# Patient Record
Sex: Male | Born: 1955
Health system: Southern US, Community
[De-identification: ages and names within clinical notes are randomized; demographics above are authoritative.]

## PROBLEM LIST (undated history)

## (undated) DIAGNOSIS — K5790 Diverticulosis of intestine, part unspecified, without perforation or abscess without bleeding: Secondary | ICD-10-CM

## (undated) DIAGNOSIS — D039 Melanoma in situ, unspecified: Secondary | ICD-10-CM

## (undated) DIAGNOSIS — I10 Essential (primary) hypertension: Secondary | ICD-10-CM

## (undated) DIAGNOSIS — Z973 Presence of spectacles and contact lenses: Secondary | ICD-10-CM

## (undated) DIAGNOSIS — N400 Enlarged prostate without lower urinary tract symptoms: Secondary | ICD-10-CM

## (undated) HISTORY — PX: CATARACT EXTRACTION: SUR2

## (undated) HISTORY — DX: Benign prostatic hyperplasia without lower urinary tract symptoms: N40.0

## (undated) HISTORY — DX: Melanoma in situ, unspecified: D03.9

## (undated) HISTORY — DX: Essential (primary) hypertension: I10

## (undated) HISTORY — PX: BIOPSY EYE MUSCLE: PRO14

## (undated) HISTORY — PX: OTHER SURGICAL HISTORY: SHX169

## (undated) HISTORY — DX: Diverticulosis of intestine, part unspecified, without perforation or abscess without bleeding: K57.90

---

## 1992-02-28 HISTORY — PX: HERNIA REPAIR: SHX51

## 2006-06-11 ENCOUNTER — Ambulatory Visit: Payer: Self-pay | Admitting: Family Medicine

## 2006-07-06 LAB — HM COLONOSCOPY

## 2007-05-21 ENCOUNTER — Ambulatory Visit: Payer: Self-pay | Admitting: Family Medicine

## 2007-06-26 ENCOUNTER — Ambulatory Visit: Payer: Self-pay | Admitting: Family Medicine

## 2007-07-26 ENCOUNTER — Ambulatory Visit: Payer: Self-pay | Admitting: Family Medicine

## 2007-08-28 ENCOUNTER — Ambulatory Visit: Payer: Self-pay | Admitting: Family Medicine

## 2008-10-19 ENCOUNTER — Ambulatory Visit: Payer: Self-pay | Admitting: Family Medicine

## 2008-11-11 ENCOUNTER — Ambulatory Visit: Payer: Self-pay | Admitting: Family Medicine

## 2009-03-10 ENCOUNTER — Ambulatory Visit: Payer: Self-pay | Admitting: Family Medicine

## 2009-10-26 ENCOUNTER — Ambulatory Visit: Payer: Self-pay | Admitting: Family Medicine

## 2010-11-02 ENCOUNTER — Encounter: Payer: Self-pay | Admitting: Family Medicine

## 2010-11-07 ENCOUNTER — Ambulatory Visit (INDEPENDENT_AMBULATORY_CARE_PROVIDER_SITE_OTHER): Payer: Federal, State, Local not specified - PPO | Admitting: Family Medicine

## 2010-11-07 ENCOUNTER — Encounter: Payer: Self-pay | Admitting: Family Medicine

## 2010-11-07 VITALS — BP 130/88 | HR 88 | Ht 65.0 in | Wt 103.0 lb

## 2010-11-07 DIAGNOSIS — N529 Male erectile dysfunction, unspecified: Secondary | ICD-10-CM

## 2010-11-07 DIAGNOSIS — N400 Enlarged prostate without lower urinary tract symptoms: Secondary | ICD-10-CM | POA: Insufficient documentation

## 2010-11-07 DIAGNOSIS — I10 Essential (primary) hypertension: Secondary | ICD-10-CM | POA: Insufficient documentation

## 2010-11-07 DIAGNOSIS — Z0000 Encounter for general adult medical examination without abnormal findings: Secondary | ICD-10-CM

## 2010-11-07 DIAGNOSIS — N486 Induration penis plastica: Secondary | ICD-10-CM | POA: Insufficient documentation

## 2010-11-07 LAB — COMPREHENSIVE METABOLIC PANEL
ALT: 10 U/L (ref 0–53)
AST: 17 U/L (ref 0–37)
Albumin: 4.1 g/dL (ref 3.5–5.2)
Calcium: 9.1 mg/dL (ref 8.4–10.5)
Chloride: 100 mEq/L (ref 96–112)
Potassium: 3.9 mEq/L (ref 3.5–5.3)

## 2010-11-07 LAB — POCT URINALYSIS DIPSTICK
Blood, UA: NEGATIVE
Nitrite, UA: NEGATIVE
Spec Grav, UA: 1.005
Urobilinogen, UA: NEGATIVE
pH, UA: 5

## 2010-11-07 LAB — CBC WITH DIFFERENTIAL/PLATELET
Basophils Relative: 1 % (ref 0–1)
Eosinophils Absolute: 0.2 10*3/uL (ref 0.0–0.7)
Eosinophils Relative: 3 % (ref 0–5)
MCH: 34 pg (ref 26.0–34.0)
MCHC: 35.3 g/dL (ref 30.0–36.0)
MCV: 96.3 fL (ref 78.0–100.0)
Monocytes Relative: 7 % (ref 3–12)
Neutrophils Relative %: 73 % (ref 43–77)
Platelets: 223 10*3/uL (ref 150–400)

## 2010-11-07 MED ORDER — HYDROCHLOROTHIAZIDE 12.5 MG PO CAPS: 12.5000 mg | ORAL_CAPSULE | Freq: Every day | ORAL | Status: DC

## 2010-11-07 MED ORDER — TADALAFIL 10 MG PO TABS: 10.0000 mg | ORAL_TABLET | Freq: Every day | ORAL | Status: DC | PRN

## 2010-11-07 NOTE — Progress Notes (Signed)
  Subjective:    Patient ID: Jerry Douglas, male    DOB: 25-Feb-1956, 55 y.o.   MRN: [id number]  HPI He is here for complete examination. He did see a urologist recently and is being treated for Peyronie's disease with vitamin D. There is some improvement. He also continues on Wellbutrin. He was put on initially to help with smoking but most recently to help deal with work related stress. He is planning to retire within the next several months and we will readdress the use at that time. He does have erectile dysfunction and would like a refill on his Cialis. Family history and social history was reviewed. He is in a very stable long-term relationship.   Review of Systems  Constitutional: Negative.   HENT: Negative.   Eyes: Negative.   Respiratory: Negative.   Cardiovascular: Negative.   Gastrointestinal: Negative.   Genitourinary: Negative.   Musculoskeletal: Negative.   Skin: Negative.   Neurological: Negative.   Psychiatric/Behavioral: Negative.        Objective:   Physical Exam BP 130/88  Pulse 88  Ht 5\' 5"  (1.651 m)  Wt 103 lb (46.72 kg)  BMI 17.14 kg/m2  General Appearance:    Alert, cooperative, no distress, appears stated age  Head:    Normocephalic, without obvious abnormality, atraumatic  Eyes:    PERRL, conjunctiva/corneas clear, EOM's intact, fundi    benign  Ears:    Normal TM's and external ear canals  Nose:   Nares normal, mucosa normal, no drainage or sinus   tenderness  Throat:   Lips, mucosa, and tongue normal; teeth and gums normal  Neck:   Supple, no lymphadenopathy;  thyroid:  no   enlargement/tenderness/nodules; no carotid   bruit or JVD  Back:    Spine nontender, no curvature, ROM normal, no CVA     tenderness  Lungs:     Clear to auscultation bilaterally without wheezes, rales or     ronchi; respirations unlabored  Chest Wall:    No tenderness or deformity   Heart:    Regular rate and rhythm, S1 and S2 normal, no murmur, rub   or gallop  Breast Exam:     No chest wall tenderness, masses or gynecomastia  Abdomen:     Soft, non-tender, nondistended, normoactive bowel sounds,    no masses, no hepatosplenomegaly        Extremities:   No clubbing, cyanosis or edema  Pulses:   2+ and symmetric all extremities  Skin:   Skin color, texture, turgor normal, no rashes or lesions  Lymph nodes:   Cervical, supraclavicular, and axillary nodes normal  Neurologic:   CNII-XII intact, normal strength, sensation and gait; reflexes 2+ and symmetric throughout          Psych:   Normal mood, affect, hygiene and grooming.           Assessment & Plan:   1. Physical exam, annual  POCT Urinalysis Dipstick, CBC with Differential, Comp Met (CMET), Lipid panel  2. Peyronie's disease    3. Hypertension    4. BPH (benign prostatic hyperplasia)    5. ED (erectile dysfunction)     His blood pressure medicine and Cialis were renewed. He will followup with his urologist concerning the Peyroni's disease

## 2010-11-08 ENCOUNTER — Telehealth: Payer: Self-pay | Admitting: Family Medicine

## 2010-11-08 ENCOUNTER — Telehealth: Payer: Self-pay

## 2010-11-08 MED ORDER — BUPROPION HCL ER (SR) 150 MG PO TB12: 150.0000 mg | ORAL_TABLET | Freq: Two times a day (BID) | ORAL | Status: DC

## 2010-11-08 NOTE — Telephone Encounter (Signed)
Let him know that the medication was called in

## 2010-11-08 NOTE — Telephone Encounter (Signed)
Called to inform pt labs ok

## 2011-01-31 ENCOUNTER — Ambulatory Visit (INDEPENDENT_AMBULATORY_CARE_PROVIDER_SITE_OTHER): Payer: Federal, State, Local not specified - PPO | Admitting: Family Medicine

## 2011-01-31 ENCOUNTER — Encounter: Payer: Self-pay | Admitting: Family Medicine

## 2011-01-31 VITALS — BP 142/90 | HR 86 | Wt 103.0 lb

## 2011-01-31 DIAGNOSIS — Z5689 Other problems related to employment: Secondary | ICD-10-CM

## 2011-01-31 DIAGNOSIS — Z566 Other physical and mental strain related to work: Secondary | ICD-10-CM

## 2011-01-31 NOTE — Progress Notes (Signed)
  Subjective:    Patient ID: Jerry Douglas, male    DOB: 1955/04/23, 55 y.o.   MRN: [id number]  HPI Is here for consultation concerning using of his sick time prior to retiring. He can be out of work for 3 days before he needs a note. He would like me to write him a note to stay out of work for several weeks prior to his retiring. I explained to him that I have no medical reason to take him out of work. He then stated taking them out for work related stress. I then relayed the fact that everybody can take time out of work for stress and if this was an issue he would need to be referred to counseling. He did understand and accept my explanation.   Review of Systems     Objective:   Physical Exam        Assessment & Plan:

## 2011-07-13 ENCOUNTER — Encounter (HOSPITAL_COMMUNITY): Admission: EM | Disposition: A | Discharge status: Home / Self Care | Payer: Self-pay | Source: Home / Self Care

## 2011-07-13 ENCOUNTER — Encounter (HOSPITAL_COMMUNITY): Payer: Self-pay | Admitting: *Deleted

## 2011-07-13 ENCOUNTER — Emergency Department (HOSPITAL_COMMUNITY)
Admission: EM | Admit: 2011-07-13 | Discharge: 2011-07-14 | Disposition: A | Discharge status: Home / Self Care | Payer: Federal, State, Local not specified - PPO | Attending: Gastroenterology | Admitting: Gastroenterology

## 2011-07-13 DIAGNOSIS — R1310 Dysphagia, unspecified: Secondary | ICD-10-CM | POA: Insufficient documentation

## 2011-07-13 DIAGNOSIS — Z79899 Other long term (current) drug therapy: Secondary | ICD-10-CM | POA: Insufficient documentation

## 2011-07-13 DIAGNOSIS — I10 Essential (primary) hypertension: Secondary | ICD-10-CM | POA: Insufficient documentation

## 2011-07-13 DIAGNOSIS — M79609 Pain in unspecified limb: Secondary | ICD-10-CM | POA: Insufficient documentation

## 2011-07-13 DIAGNOSIS — M79606 Pain in leg, unspecified: Secondary | ICD-10-CM

## 2011-07-13 DIAGNOSIS — R6889 Other general symptoms and signs: Secondary | ICD-10-CM | POA: Insufficient documentation

## 2011-07-13 HISTORY — PX: ESOPHAGOGASTRODUODENOSCOPY: SHX5428

## 2011-07-13 SURGERY — EGD (ESOPHAGOGASTRODUODENOSCOPY)
Anesthesia: Moderate Sedation

## 2011-07-13 MED ORDER — SODIUM CHLORIDE 0.9 % IV BOLUS (SEPSIS)
500.0000 mL | Freq: Once | INTRAVENOUS | Status: AC
  Administered 2011-07-13: 500 mL via INTRAVENOUS

## 2011-07-13 NOTE — ED Notes (Signed)
Pt still able to talk in complete sentences, no respiratory difficulties.  Food lodged in esophagus.

## 2011-07-13 NOTE — ED Notes (Signed)
Pt presented to the Er with c/o "choking", pt states that after eating piece of food got stock, pt ab;e to speak and breath, however, states "it is still stuck and bothering me", peace of stake

## 2011-07-13 NOTE — ED Notes (Addendum)
Pt becoming increasingly impatient, wants to know where the doctor is and why he hasn't been seen yet.  Tried to explain how the process work but he just went back into his room and shut the door.

## 2011-07-13 NOTE — ED Notes (Signed)
Pt in c/o piece of steak being stuck in throat, states that he is unable to tolerate and fluids, pt not tolerating saliva at this time, pt talking

## 2011-07-13 NOTE — ED Provider Notes (Signed)
History     CSN: [id number]  Arrival date & time 07/13/11  2008   First MD Initiated Contact with Patient 07/13/11 2231      Chief Complaint  Patient presents with  . Food stuck in throat     (Consider location/radiation/quality/duration/timing/severity/associated sxs/prior treatment) Patient is a 56 y.o. male presenting with foreign body. The history is provided by the patient. No language interpreter was used.  Foreign Body  The current episode started 1 to 2 hours ago. Associated symptoms include trouble swallowing. Pertinent negatives include no chest pain, no fever, no abdominal pain, no vomiting, no choking and no difficulty breathing.   56 year old male coming in tonight with complaint of unable to swallow his saliva after eating a steak. States that he feels like he has something stuck in his throat. This past medical history of esophageal stricture. No GI.Marland Kitchen No acute distress chest unable to swallow his sputum. Past medical history of hypertension BPH ED and peyronies disease. Will consult GI.   Past Medical History  Diagnosis Date  . Hypertension   . BPH (benign prostatic hyperplasia)   . Diverticulosis   . Hemorrhoids     Past Surgical History  Procedure Date  . Hernia repair     Family History  Problem Relation Age of Onset  . Hypertension Mother   . Stroke Father   . Diabetes Father   . Hypertension Father   . Hyperlipidemia Father   . Diabetes Sister   . Hypertension Sister   . Hyperlipidemia Sister     History  Substance Use Topics  . Smoking status: Former Games developer  . Smokeless tobacco: Never Used  . Alcohol Use: 4.2 oz/week    7 Cans of beer per week      Review of Systems  Constitutional: Negative.  Negative for fever.  HENT: Positive for trouble swallowing.   Eyes: Negative.   Respiratory: Negative.  Negative for choking, shortness of breath, wheezing and stridor.   Cardiovascular: Negative.  Negative for chest pain.  Gastrointestinal:  Negative.  Negative for vomiting and abdominal pain.  Neurological: Negative.   Psychiatric/Behavioral: Negative.   All other systems reviewed and are negative.    Allergies  Review of patient's allergies indicates no known allergies.  Home Medications   Current Outpatient Rx  Name Route Sig Dispense Refill  . BUPROPION HCL ER (SR) 150 MG PO TB12 Oral Take 1 tablet (150 mg total) by mouth 2 (two) times daily. 60 tablet 12  . HYDROCHLOROTHIAZIDE 12.5 MG PO CAPS Oral Take 1 capsule (12.5 mg total) by mouth daily. 30 capsule 12  . MULTI-VITAMIN/MINERALS PO TABS Oral Take 1 tablet by mouth daily.      . ALEVE PO Oral Take 2 capsules by mouth daily.      Marland Kitchen TADALAFIL 10 MG PO TABS Oral Take 1 tablet (10 mg total) by mouth daily as needed. 10 tablet 5  . VITAMIN E 1000 UNITS PO CAPS Oral Take 1,000 Units by mouth daily.        There were no vitals taken for this visit.  Physical Exam  Nursing note and vitals reviewed. Constitutional: He is oriented to person, place, and time. He appears well-developed and well-nourished.  HENT:  Head: Normocephalic.  Eyes: Conjunctivae and EOM are normal. Pupils are equal, round, and reactive to light.  Neck: Normal range of motion. Neck supple.  Cardiovascular: Normal rate, regular rhythm and normal heart sounds.   Pulmonary/Chest: Effort normal and breath sounds normal. No  respiratory distress. He has no wheezes.  Abdominal: Soft. Bowel sounds are normal. He exhibits no distension. There is no tenderness.  Musculoskeletal: Normal range of motion.  Neurological: He is alert and oriented to person, place, and time.  Skin: Skin is warm and dry.  Psychiatric: He has a normal mood and affect.    ED Course  Procedures (including critical care time)  Labs Reviewed - No data to display No results found.   No diagnosis found.    MDM  Benadryl has adhesive beats stuck in his esophagus. Dr. Dulce Sellar will be and to remove it.        Remi Haggard, NP 07/13/11 [phone]

## 2011-07-14 ENCOUNTER — Encounter (HOSPITAL_COMMUNITY): Payer: Self-pay | Admitting: Gastroenterology

## 2011-07-14 MED ORDER — OMEPRAZOLE MAGNESIUM 20 MG PO TBEC: 20.0000 mg | DELAYED_RELEASE_TABLET | Freq: Every day | ORAL | Status: DC

## 2011-07-14 MED ORDER — FENTANYL NICU IV SYRINGE 50 MCG/ML
INJECTION | INTRAMUSCULAR | Status: DC | PRN
  Administered 2011-07-14 (×2): 25 ug via INTRAVENOUS

## 2011-07-14 MED ORDER — SODIUM CHLORIDE 0.9 % IV SOLN: Freq: Once | INTRAVENOUS | Status: DC

## 2011-07-14 MED ORDER — MIDAZOLAM HCL 10 MG/2ML IJ SOLN
INTRAMUSCULAR | Status: DC | PRN
  Administered 2011-07-14 (×2): 2 mg via INTRAVENOUS
  Administered 2011-07-14: 1 mg via INTRAVENOUS

## 2011-07-14 NOTE — H&P (Signed)
CC:  Food stuck in esophagus  HPI:  56 yo male, healthy, without prior GI troubles, presents with esophageal food impaction after steak meal.  Has sialorrhea.  No chest pain.  NO prior dysphagia.  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    MEDS:  HCTZ, Buproprion  ROS:  As per HPI; all others negative.  PE: GEN:  Somewhat cachectic, NAD NECK:  Thin, supple LUNGS:  CTA CV:  RRR ABD:  Soft EXT:  No CCE NEURO:  Non-focal  Labs/imaging:  None  Assessment:  1.  Likely esophageal food impaction.  Plan: 1.  Endoscopy with possible foreign body extraction. 2.  Risks (bleeding, infection, bowel perforation that could require surgery, sedation-related changes in cardiopulmonary systems), benefits (identification and possible treatment of source of symptoms, exclusion of certain causes of symptoms), and alternatives (watchful waiting, radiographic imaging studies, empiric medical treatment) of upper endoscopy (EGD) were explained to patient in detail and he wishes to proceed.

## 2011-07-14 NOTE — OR Nursing (Signed)
Pt was recovered in endoscopy department until alert and awake and off oxygen.  Reviewed with pt and partner at bedside d/c instructions about diet and not driving post sedation.  Pt was brought back to ED and report was given to ED RN; to be discharged through ED.  Angelique Blonder, RN

## 2011-07-14 NOTE — Op Note (Signed)
[facility] [address] [place], [place]  [address]  ENDOSCOPY PROCEDURE REPORT  PATIENT:  Jerry Douglas, Jerry Douglas  MR#:  [id number] BIRTHDATE:  07/17/1955, 55 yrs. old  GENDER:  male  ENDOSCOPIST:  Willis Modena, MD Referred by:  Sharlot Gowda, M.D.  PROCEDURE DATE:  07/14/2011 PROCEDURE:  EGD with foreign body removal ASA CLASS:  Class II INDICATIONS:  food impaction  MEDICATIONS:   Fentanyl 50 mcg IV, Versed 5 mg IV  DESCRIPTION OF PROCEDURE:   After the risks benefits and alternatives of the procedure were thoroughly explained, informed consent was obtained.  The Pentax Gastroscope [id number] endoscope was introduced through the mouth and advanced to the second portion of the duodenum, without limitations.  The instrument was slowly withdrawn as the mucosa was fully examined.  <<PROCEDUREIMAGES>>  FINDINGS:  Readily apparent food bolus impaction in proximal esophagus, just distal to the upper esophageal sphincter.  Bolus was grasped with talon-grasping snare and removed through the mouth.  Post extraction, there was a proximal esophageal stricture, benign-appearing, with underlying ulceration.  There was some columnar-appearing mucosa in this area; unclear if this is related to desquamation, or whether the stricture happens to be located concomitantly in region of gastric inlet patch.  Remainder of esophagus was normal.  No mucosal changes of eosinophilic esophagitis were identified.  Moderate gastritis with superficial ulceration seen in pre-pyloric antrum. Otherwise normal stomach. Mild bulbar erythema and duodenitis, otherwise normal duodenum to the second portion.  ENDOSCOPIC IMPRESSION:    1.  Esophageal food impaction, resolved as above. 2. Proximal esophageal stricture, benign-appearing, ? NSAID-related. 3.  Antroduodenal erythema, inflammation and ulceration, ? NSAID-related. 4.  Otherwise normal endoscopy.  RECOMMENDATIONS:      1.  Watch for potential  complications of procedure. 2.  No NSAIDs. 3.  Prilosec OTC 20 mg once-a-day, until further notice. 4.  Pureed-type diet until further notice. 5.  Repeat endoscopy, with possible stricture biopsy and/or dilation, in the next couple weeks. 6.  OK to be discharged home today.  REPEAT EXAM:  Yes,   2 weeks, our office to schedule.  ______________________________ Willis Modena  CC:  n. eSIGNEDWillis Modena at 07/14/2011 12:39 AM  Darcella Cheshire, [id number]

## 2011-07-14 NOTE — ED Notes (Signed)
ZOX:[id number]<AV> Expected date:<BR> Expected time:<BR> Means of arrival:<BR> Comments:<BR> Hold

## 2011-07-14 NOTE — Discharge Instructions (Signed)
Endoscopy Care After Please read the instructions outlined below and refer to this sheet in the next few weeks. These discharge instructions provide you with general information on caring for yourself after you leave the hospital. Your doctor may also give you specific instructions. While your treatment has been planned according to the most current medical practices available, unavoidable complications occasionally occur. If you have any problems or questions after discharge, please call Dr. Dulce Sellar [facility]) at [phone].  HOME CARE INSTRUCTIONS Activity  You may resume your regular activity but move at a slower pace for the next 24 hours.   Take frequent rest periods for the next 24 hours.   Walking will help expel (get rid of) the air and reduce the bloated feeling in your abdomen.   No driving for 24 hours (because of the anesthesia (medicine) used during the test).   You may shower.   Do not sign any important legal documents or operate any machinery for 24 hours (because of the anesthesia used during the test).  Nutrition  Drink plenty of fluids.   Soft, pureed-type diet until further notice.  Avoid fibrous meats (steak, chicken), raw fruits/vegetables, fibrous breads (especially French breads) until further notice.  Avoid alcoholic beverages for 24 hours or as instructed by your caregiver.  Medications Start Prilosec 20 mg OTC, one tablet once-a-day, continue until otherwise specified. No antiinflammatory medications (aspirin, motrin, ibuprofen, BC/Goody's powders, alleve, naproxen, excedrin). You may resume your normal medications unless your caregiver tells you otherwise. What you can expect today  You may experience abdominal discomfort such as a feeling of fullness or "gas" pains.   You may experience a sore throat for 2 to 3 days. This is normal. Gargling with salt water may help this.    SEEK IMMEDIATE MEDICAL CARE IF:  You have excessive nausea  (feeling sick to your stomach) and/or vomiting.   You have severe abdominal pain and distention (swelling).   You have trouble swallowing.   You have a temperature over 100 F (37.8 C).   You have rectal bleeding or vomiting of blood.  Document Released: 09/28/2003 Document Revised: 10/26/2010 Document Reviewed: 04/10/2007 [facility] [address] Patient Information 2012 [place], [place].

## 2011-07-24 NOTE — ED Provider Notes (Signed)
Medical screening examination/treatment/procedure(s) were performed by non-physician practitioner and as supervising physician I was immediately available for consultation/collaboration.   Aurorah Schlachter, MD 07/24/11 0707 

## 2011-08-14 ENCOUNTER — Encounter (HOSPITAL_COMMUNITY): Payer: Self-pay

## 2011-08-15 ENCOUNTER — Ambulatory Visit (HOSPITAL_COMMUNITY)
Admission: RE | Admit: 2011-08-15 | Discharge: 2011-08-15 | Disposition: A | Discharge status: Home / Self Care | Payer: Federal, State, Local not specified - PPO | Source: Ambulatory Visit | Attending: Gastroenterology | Admitting: Gastroenterology

## 2011-08-15 ENCOUNTER — Encounter: Payer: Self-pay | Admitting: Internal Medicine

## 2011-08-15 ENCOUNTER — Encounter (HOSPITAL_COMMUNITY): Payer: Self-pay | Admitting: Gastroenterology

## 2011-08-15 ENCOUNTER — Encounter (HOSPITAL_COMMUNITY)
Admission: RE | Disposition: A | Discharge status: Home / Self Care | Payer: Self-pay | Source: Ambulatory Visit | Attending: Gastroenterology

## 2011-08-15 DIAGNOSIS — R1310 Dysphagia, unspecified: Secondary | ICD-10-CM | POA: Insufficient documentation

## 2011-08-15 HISTORY — PX: UPPER GASTROINTESTINAL ENDOSCOPY: SHX188

## 2011-08-15 HISTORY — PX: ESOPHAGOGASTRODUODENOSCOPY: SHX5428

## 2011-08-15 HISTORY — PX: BALLOON DILATION: SHX5330

## 2011-08-15 SURGERY — EGD (ESOPHAGOGASTRODUODENOSCOPY)
Anesthesia: Moderate Sedation

## 2011-08-15 MED ORDER — MIDAZOLAM HCL 10 MG/2ML IJ SOLN
INTRAMUSCULAR | Status: DC | PRN
  Administered 2011-08-15 (×2): 2.5 mg via INTRAVENOUS
  Administered 2011-08-15: 1 mg via INTRAVENOUS

## 2011-08-15 MED ORDER — FENTANYL NICU IV SYRINGE 50 MCG/ML
INJECTION | INTRAMUSCULAR | Status: DC | PRN
  Administered 2011-08-15 (×2): 25 ug via INTRAVENOUS

## 2011-08-15 MED ORDER — DIPHENHYDRAMINE HCL 50 MG/ML IJ SOLN
INTRAMUSCULAR | Status: AC
  Filled 2011-08-15: qty 1

## 2011-08-15 MED ORDER — BUTAMBEN-TETRACAINE-BENZOCAINE 2-2-14 % EX AERO
INHALATION_SPRAY | CUTANEOUS | Status: DC | PRN
  Administered 2011-08-15: 2 via TOPICAL

## 2011-08-15 MED ORDER — SODIUM CHLORIDE 0.9 % IV SOLN
Freq: Once | INTRAVENOUS | Status: AC
  Administered 2011-08-15: 500 mL via INTRAVENOUS

## 2011-08-15 MED ORDER — FENTANYL CITRATE 0.05 MG/ML IJ SOLN
INTRAMUSCULAR | Status: AC
  Filled 2011-08-15: qty 4

## 2011-08-15 MED ORDER — NAPROXEN SODIUM 220 MG PO TABS: 220.0000 mg | ORAL_TABLET | Freq: Two times a day (BID) | ORAL | Status: AC | PRN

## 2011-08-15 MED ORDER — MIDAZOLAM HCL 10 MG/2ML IJ SOLN
INTRAMUSCULAR | Status: AC
  Filled 2011-08-15: qty 4

## 2011-08-15 NOTE — H&P (Signed)
Patient interval history reviewed.  Patient examined again.  There has been no change from documented H/P dated 07/27/11 (scanned into chart from our office) except as documented above.  Assessment: 1.  Recent esophageal food impaction. 2.  Possible proximal esophageal stricture.  Plan:  1.  Endoscopy with possible biopsy, possible esophageal dilatation. 2.  Risks (bleeding, infection, bowel perforation that could require surgery, sedation-related changes in cardiopulmonary systems), benefits (identification and possible treatment of source of symptoms, exclusion of certain causes of symptoms), and alternatives (watchful waiting, radiographic imaging studies, empiric medical treatment) of upper endoscopy with esophageal dilatation  (EGD +/- dil) were explained to patient in detail and he wishes to proceed.

## 2011-08-15 NOTE — Op Note (Signed)
[facility] [address] [place], [place]  [address]  ENDOSCOPY PROCEDURE REPORT  PATIENT:  Jerry Douglas, Jerry Douglas  MR#:  [id number] BIRTHDATE:  12-Oct-1955, 55 yrs. old  GENDER:  male ENDOSCOPIST:  Willis Modena, MD Referred by:  Sharlot Gowda, M.D. PROCEDURE DATE:  08/15/2011 PROCEDURE:  EGD with biopsy, 43239 ASA CLASS:  Class II INDICATIONS:  dysphagia, prior food impaction MEDICATIONS:    Cetacaine spray x 2, Fentanyl 50 mcg IV, Versed 6 mg IV DESCRIPTION OF PROCEDURE:   After the risks benefits and alternatives of the procedure were thoroughly explained, informed consent was obtained.  The Pentax Gastroscope [id number] endoscope was introduced through the mouth and advanced to the second portion of the duodenum, without limitations.  The instrument was slowly withdrawn as the mucosa was fully examined. <<PROCEDUREIMAGES>> FINDINGS:  Large confluent gastric inlet patch, biopsied, in proximal esophagus.  Distal to this, beginning in very proximal esophagus, the esophagus is diffusely narrow caliber but without obvious stricturing.  No ulceration or mass seen.  Some very subtle linear furrows and crepe-paper mucosa of the esophagus seen.  Mid and distal esophagus biopsies taken to evaluate for eosinophilic esophagitis.  GE junction crisp without evidence of Barrett's mucosa or esophagitis.  Remainder of endoscopic exam to level of second portion of duodenum was normal.  ENDOSCOPIC IMPRESSION:    1.  Suspected proximal gastric inlet patch, biopsied. 2.  No overt stricture of esophagus seen, but lumen is diffusely narrow. 3.  Esophageal biopsies taken to assess for eosinophilic esophagitis. 4.  Otherwise normal endoscopy.  RECOMMENDATIONS:      1.  Watch for potential complications of procedure. 2.  Await biopsies. 3.  OK to stop Prilosec. 4.  Liberalize diet, but chew food well. 5.  Follow-up with [place] in 6-8 weeks.  ______________________________ Willis Modena  CC:  n. eSIGNEDWillis Modena at 08/15/2011 09:18 AM  Darcella Cheshire, [id number]

## 2011-08-15 NOTE — Discharge Instructions (Signed)
Endoscopy °Care After °Please read the instructions outlined below and refer to this sheet in the next few weeks. These discharge instructions provide you with general information on caring for yourself after you leave the hospital. Your doctor may also give you specific instructions. While your treatment has been planned according to the most current medical practices available, unavoidable complications occasionally occur. If you have any problems or questions after discharge, please call Dr. Liany Mumpower ([facility]) at [phone]. ° °HOME CARE INSTRUCTIONS °Activity °· You may resume your regular activity but move at a slower pace for the next 24 hours.  °· Take frequent rest periods for the next 24 hours.  °· Walking will help expel (get rid of) the air and reduce the bloated feeling in your abdomen.  °· No driving for 24 hours (because of the anesthesia (medicine) used during the test).  °· You may shower.  °· Do not sign any important legal documents or operate any machinery for 24 hours (because of the anesthesia used during the test).  °Nutrition °· Drink plenty of fluids.  °· You may resume your normal diet.  °· Begin with a light meal and progress to your normal diet.  °· Avoid alcoholic beverages for 24 hours or as instructed by your caregiver.  °Medications °You may resume your normal medications unless your caregiver tells you otherwise. °What you can expect today °· You may experience abdominal discomfort such as a feeling of fullness or "gas" pains.  °· You may experience a sore throat for 2 to 3 days. This is normal. Gargling with salt water may help this.  °·  °SEEK IMMEDIATE MEDICAL CARE IF: °· You have excessive nausea (feeling sick to your stomach) and/or vomiting.  °· You have severe abdominal pain and distention (swelling).  °· You have trouble swallowing.  °· You have a temperature over 100° F (37.8° C).  °· You have rectal bleeding or vomiting of blood.  °Document Released:  09/28/2003 Document Revised: 10/26/2010 Document Reviewed: 04/10/2007 °[employer]® Patient Information ©2012 [employer]. °

## 2011-08-16 ENCOUNTER — Encounter (HOSPITAL_COMMUNITY): Payer: Self-pay

## 2011-08-16 ENCOUNTER — Encounter: Payer: Self-pay | Admitting: Family Medicine

## 2011-08-16 ENCOUNTER — Encounter (HOSPITAL_COMMUNITY): Payer: Self-pay | Admitting: Gastroenterology

## 2011-11-13 ENCOUNTER — Ambulatory Visit (INDEPENDENT_AMBULATORY_CARE_PROVIDER_SITE_OTHER): Payer: Federal, State, Local not specified - PPO | Admitting: Family Medicine

## 2011-11-13 ENCOUNTER — Encounter: Payer: Self-pay | Admitting: Family Medicine

## 2011-11-13 VITALS — BP 140/80 | HR 80 | Temp 98.3°F | Resp 16 | Ht 65.5 in | Wt 101.0 lb

## 2011-11-13 DIAGNOSIS — I10 Essential (primary) hypertension: Secondary | ICD-10-CM

## 2011-11-13 DIAGNOSIS — N529 Male erectile dysfunction, unspecified: Secondary | ICD-10-CM

## 2011-11-13 DIAGNOSIS — N400 Enlarged prostate without lower urinary tract symptoms: Secondary | ICD-10-CM

## 2011-11-13 DIAGNOSIS — Z125 Encounter for screening for malignant neoplasm of prostate: Secondary | ICD-10-CM

## 2011-11-13 DIAGNOSIS — F341 Dysthymic disorder: Secondary | ICD-10-CM | POA: Insufficient documentation

## 2011-11-13 DIAGNOSIS — Z0000 Encounter for general adult medical examination without abnormal findings: Secondary | ICD-10-CM

## 2011-11-13 LAB — POCT URINALYSIS DIPSTICK
Bilirubin, UA: NEGATIVE
Ketones, UA: NEGATIVE
Leukocytes, UA: NEGATIVE
pH, UA: 6

## 2011-11-13 LAB — LIPID PANEL
HDL: 70 mg/dL (ref >39)
Triglycerides: 55 mg/dL (ref <150)

## 2011-11-13 LAB — HEMOCCULT GUIAC POC 1CARD (OFFICE)

## 2011-11-13 MED ORDER — HYDROCHLOROTHIAZIDE 12.5 MG PO CAPS: 12.5000 mg | ORAL_CAPSULE | Freq: Every day | ORAL | Status: DC

## 2011-11-13 MED ORDER — BUPROPION HCL ER (SR) 150 MG PO TB12: 150.0000 mg | ORAL_TABLET | ORAL | Status: DC

## 2011-11-13 NOTE — Addendum Note (Signed)
Addended by: Ronnald Nian on: 11/13/2011 09:02 AM   Modules accepted: Orders

## 2011-11-13 NOTE — Progress Notes (Signed)
  Subjective:    Patient ID: Jerry Douglas, male    DOB: 05-13-1955, 56 y.o.   MRN: [id number]  HPI He is here for complete examination he is alternating taking the Wellbutrin between once and twice a day based on stress. He continues on other medications listed in the chart. He does have BPH symptoms of urgency as well as incomplete emptying, frequency. He was placed on Cialis by his urologist and his symptoms have improved he continues on HCTZ. He does not want a flu shot. He is now retired and enjoying his retirement.   Review of Systems  Constitutional: Negative.   HENT: Negative.   Eyes: Negative.   Respiratory: Negative.   Cardiovascular: Negative.   Gastrointestinal: Negative.   Genitourinary: Negative.   Musculoskeletal: Negative.   Skin: Negative.   Neurological: Negative.   Hematological: Negative.   Psychiatric/Behavioral: Negative.        Objective:   Physical Exam BP 140/80  Pulse 80  Temp 98.3 F (36.8 C) (Oral)  Resp 16  Ht 5' 5.5" (1.664 m)  Wt 101 lb (45.813 kg)  BMI 16.55 kg/m2  General Appearance:    Alert, cooperative, no distress, appears stated age  Head:    Normocephalic, without obvious abnormality, atraumatic  Eyes:    PERRL, conjunctiva/corneas clear, EOM's intact, fundi    benign  Ears:    Normal TM's and external ear canals  Nose:   Nares normal, mucosa normal, no drainage or sinus   tenderness  Throat:   Lips, mucosa, and tongue normal; teeth and gums normal  Neck:   Supple, no lymphadenopathy;  thyroid:  no   enlargement/tenderness/nodules; no carotid   bruit or JVD  Back:    Spine nontender, no curvature, ROM normal, no CVA     tenderness  Lungs:     Clear to auscultation bilaterally without wheezes, rales or     ronchi; respirations unlabored  Chest Wall:    No tenderness or deformity   Heart:    Regular rate and rhythm, S1 and S2 normal, no murmur, rub   or gallop  Breast Exam:    No chest wall tenderness, masses or gynecomastia    Abdomen:     Soft, non-tender, nondistended, normoactive bowel sounds,    no masses, no hepatosplenomegaly  Genitalia:    Normal male external genitalia without lesions.  Testicles without masses.  No inguinal hernias.  Rectal:    Normal sphincter tone, no masses or tenderness; guaiac negative stool.  Prostate smooth, no nodules, not enlarged.  Extremities:   No clubbing, cyanosis or edema  Pulses:   2+ and symmetric all extremities  Skin:   Skin color, texture, turgor normal, no rashes or lesions  Lymph nodes:   Cervical, supraclavicular, and axillary nodes normal  Neurologic:   CNII-XII intact, normal strength, sensation and gait; reflexes 2+ and symmetric throughout          Psych:   Normal mood, affect, hygiene and grooming.            Assessment & Plan:   1. BPH (benign prostatic hyperplasia)  PSA  2. ED (erectile dysfunction)    3. Hypertension    4. Dysthymia    5. Routine general medical examination at a health care facility  Urinalysis Dipstick, Visual acuity screening, Lipid panel, Hemoccult - 1 Card (office)  6. Special screening for malignant neoplasm of prostate  PSA

## 2011-11-14 LAB — PSA: PSA: 0.48 ng/mL (ref <=4.00)

## 2012-09-17 ENCOUNTER — Ambulatory Visit (INDEPENDENT_AMBULATORY_CARE_PROVIDER_SITE_OTHER): Payer: Federal, State, Local not specified - PPO | Admitting: Family Medicine

## 2012-09-17 VITALS — BP 120/80 | HR 94 | Wt 104.0 lb

## 2012-09-17 DIAGNOSIS — M6530 Trigger finger, unspecified finger: Secondary | ICD-10-CM

## 2012-09-17 NOTE — Progress Notes (Signed)
  Subjective:    Patient ID: Jerry Douglas, male    DOB: 11-14-1955, 57 y.o.   MRN: [id number]  HPI Earlier today while putting a band was taken to the ground he gripped it with his right hand and when he tried to open his hand, he was unable to fully extend his third finger. No history of injury.  Review of Systems     Objective:   Physical Exam Exam of the hand shows an inability to fully extend the third finger with some tenderness to palpation over the MCP joint on the palmar surface however the dorsal surface shows no swelling or deformity in the joint line can't be palpated without difficulty.      Assessment & Plan:  Trigger finger, acquired - Plan: Ambulatory referral to Orthopedic Surgery  the mechanism of the injury makes me think he did some flexor tendon sheathtrauma at the MCP joint.

## 2012-09-18 ENCOUNTER — Encounter (HOSPITAL_BASED_OUTPATIENT_CLINIC_OR_DEPARTMENT_OTHER): Payer: Self-pay | Admitting: *Deleted

## 2012-09-18 ENCOUNTER — Other Ambulatory Visit: Payer: Self-pay | Admitting: Orthopedic Surgery

## 2012-09-18 NOTE — Progress Notes (Signed)
Mild htn-hctz-12.5-do not take in am-pt states has always been very thin- Needs istat -ekg

## 2012-09-19 ENCOUNTER — Encounter (HOSPITAL_BASED_OUTPATIENT_CLINIC_OR_DEPARTMENT_OTHER): Payer: Self-pay | Admitting: *Deleted

## 2012-09-19 ENCOUNTER — Ambulatory Visit (HOSPITAL_BASED_OUTPATIENT_CLINIC_OR_DEPARTMENT_OTHER): Payer: Federal, State, Local not specified - PPO | Admitting: *Deleted

## 2012-09-19 ENCOUNTER — Encounter (HOSPITAL_BASED_OUTPATIENT_CLINIC_OR_DEPARTMENT_OTHER)
Admission: RE | Disposition: A | Discharge status: Home / Self Care | Payer: Self-pay | Source: Ambulatory Visit | Attending: Orthopedic Surgery

## 2012-09-19 ENCOUNTER — Ambulatory Visit (HOSPITAL_BASED_OUTPATIENT_CLINIC_OR_DEPARTMENT_OTHER)
Admission: RE | Admit: 2012-09-19 | Discharge: 2012-09-19 | Disposition: A | Discharge status: Home / Self Care | Payer: Federal, State, Local not specified - PPO | Source: Ambulatory Visit | Attending: Orthopedic Surgery | Admitting: Orthopedic Surgery

## 2012-09-19 DIAGNOSIS — X500XXA Overexertion from strenuous movement or load, initial encounter: Secondary | ICD-10-CM | POA: Insufficient documentation

## 2012-09-19 DIAGNOSIS — I10 Essential (primary) hypertension: Secondary | ICD-10-CM | POA: Insufficient documentation

## 2012-09-19 DIAGNOSIS — N400 Enlarged prostate without lower urinary tract symptoms: Secondary | ICD-10-CM | POA: Insufficient documentation

## 2012-09-19 DIAGNOSIS — M898X9 Other specified disorders of bone, unspecified site: Secondary | ICD-10-CM | POA: Insufficient documentation

## 2012-09-19 DIAGNOSIS — S63269A Dislocation of metacarpophalangeal joint of unspecified finger, initial encounter: Secondary | ICD-10-CM | POA: Insufficient documentation

## 2012-09-19 DIAGNOSIS — Y92009 Unspecified place in unspecified non-institutional (private) residence as the place of occurrence of the external cause: Secondary | ICD-10-CM | POA: Insufficient documentation

## 2012-09-19 HISTORY — DX: Presence of spectacles and contact lenses: Z97.3

## 2012-09-19 LAB — POCT I-STAT, CHEM 8
BUN: 5 mg/dL — ABNORMAL LOW (ref 6–23)
Creatinine, Ser: 0.8 mg/dL (ref 0.50–1.35)
Potassium: 3.8 mEq/L (ref 3.5–5.1)
Sodium: 137 mEq/L (ref 135–145)

## 2012-09-19 SURGERY — MINOR CLOSED MANIPULATION
Anesthesia: General | Site: Hand | Laterality: Right | Wound class: Clean

## 2012-09-19 MED ORDER — LACTATED RINGERS IV SOLN
INTRAVENOUS | Status: DC
  Administered 2012-09-19: 12:00:00 via INTRAVENOUS

## 2012-09-19 MED ORDER — ROPIVACAINE HCL 5 MG/ML IJ SOLN
INTRAMUSCULAR | Status: DC | PRN
  Administered 2012-09-19: 22 mL

## 2012-09-19 MED ORDER — FENTANYL CITRATE 0.05 MG/ML IJ SOLN
50.0000 ug | INTRAMUSCULAR | Status: DC | PRN
  Administered 2012-09-19: 100 ug via INTRAVENOUS

## 2012-09-19 MED ORDER — CHLORHEXIDINE GLUCONATE 4 % EX LIQD: 60.0000 mL | Freq: Once | CUTANEOUS | Status: DC

## 2012-09-19 MED ORDER — MIDAZOLAM HCL 2 MG/2ML IJ SOLN
1.0000 mg | INTRAMUSCULAR | Status: DC | PRN
  Administered 2012-09-19: 2 mg via INTRAVENOUS

## 2012-09-19 SURGICAL SUPPLY — 34 items
BANDAGE ADHESIVE 1X3 (GAUZE/BANDAGES/DRESSINGS) IMPLANT
BANDAGE ELASTIC 3 VELCRO ST LF (GAUZE/BANDAGES/DRESSINGS) ×2 IMPLANT
BLADE SURG 15 STRL LF DISP TIS (BLADE) ×1 IMPLANT
BLADE SURG 15 STRL SS (BLADE) ×1
BNDG ESMARK 4X9 LF (GAUZE/BANDAGES/DRESSINGS) IMPLANT
BRUSH SCRUB EZ PLAIN DRY (MISCELLANEOUS) ×2 IMPLANT
CLOTH BEACON ORANGE TIMEOUT ST (SAFETY) ×2 IMPLANT
CORDS BIPOLAR (ELECTRODE) IMPLANT
COVER MAYO STAND STRL (DRAPES) ×2 IMPLANT
COVER TABLE BACK 60X90 (DRAPES) ×2 IMPLANT
CUFF TOURNIQUET SINGLE 18IN (TOURNIQUET CUFF) IMPLANT
DECANTER SPIKE VIAL GLASS SM (MISCELLANEOUS) IMPLANT
DRAPE EXTREMITY T 121X128X90 (DRAPE) ×2 IMPLANT
DRAPE SURG 17X23 STRL (DRAPES) ×2 IMPLANT
GLOVE BIOGEL M STRL SZ7.5 (GLOVE) ×2 IMPLANT
GLOVE ORTHO TXT STRL SZ7.5 (GLOVE) ×2 IMPLANT
GOWN BRE IMP PREV XXLGXLNG (GOWN DISPOSABLE) ×4 IMPLANT
GOWN PREVENTION PLUS XLARGE (GOWN DISPOSABLE) ×2 IMPLANT
NEEDLE 27GAX1X1/2 (NEEDLE) IMPLANT
PACK BASIN DAY SURGERY FS (CUSTOM PROCEDURE TRAY) ×2 IMPLANT
PADDING CAST ABS 4INX4YD NS (CAST SUPPLIES) ×1
PADDING CAST ABS COTTON 4X4 ST (CAST SUPPLIES) ×1 IMPLANT
SPONGE GAUZE 4X4 12PLY (GAUZE/BANDAGES/DRESSINGS) ×2 IMPLANT
STOCKINETTE 4X48 STRL (DRAPES) ×2 IMPLANT
STRIP CLOSURE SKIN 1/2X4 (GAUZE/BANDAGES/DRESSINGS) IMPLANT
SUT ETHILON 5 0 P 3 18 (SUTURE)
SUT NYLON ETHILON 5-0 P-3 1X18 (SUTURE) IMPLANT
SUT PROLENE 3 0 PS 2 (SUTURE) IMPLANT
SUT PROLENE 4 0 P 3 18 (SUTURE) IMPLANT
SYR 3ML 23GX1 SAFETY (SYRINGE) IMPLANT
SYR CONTROL 10ML LL (SYRINGE) IMPLANT
TOWEL OR 17X24 6PK STRL BLUE (TOWEL DISPOSABLE) ×2 IMPLANT
TRAY DSU PREP LF (CUSTOM PROCEDURE TRAY) ×2 IMPLANT
UNDERPAD 30X30 INCONTINENT (UNDERPADS AND DIAPERS) ×2 IMPLANT

## 2012-09-19 NOTE — Anesthesia Preprocedure Evaluation (Signed)

## 2012-09-19 NOTE — Anesthesia Procedure Notes (Addendum)
Anesthesia Regional Block:  Supraclavicular block  Pre-Anesthetic Checklist: ,, timeout performed, Correct Patient, Correct Site, Correct Laterality, Correct Procedure, Correct Position, site marked, Risks and benefits discussed,  Surgical consent,  Pre-op evaluation,  At surgeon's request and post-op pain management  Laterality: Right and Upper  Prep: chloraprep       Needles:  Injection technique: Single-shot  Needle Type: Echogenic Needle     Needle Length: 5cm 5 cm Needle Gauge: 21    Additional Needles:  Procedures: ultrasound guided (picture in chart) Supraclavicular block Narrative:  Start time: 09/19/2012 12:30 PM End time: 09/19/2012 12:57 PM Injection made incrementally with aspirations every 5 mL.  Performed by: Personally  Anesthesiologist: Sheldon Silvan  Supraclavicular block

## 2012-09-19 NOTE — Anesthesia Postprocedure Evaluation (Signed)
Patient had a complete block pre op in the holding area. VS were monitored. Dr. Teressa Senter changed his plan and manipulated the patient's finger in the holding area with the block in place.    He was stable and feeling no effects from his sedation.  He was given post procedure instructions and discharged with an arm sling for protection.

## 2012-09-19 NOTE — Transfer of Care (Signed)
Case cancelled

## 2012-09-19 NOTE — Consult Note (Signed)
  Jerry Douglas is examined in the: Surgical center holding area after Dr. Ivin Booty placed a supraclavicular block. Complete anesthesia of the right arm and paralysis of the forearm musculature was noted. The finger was able to be easily manipulated into an extended position at the MP joint by applying ulnar deviation and gentle extension pressure to the metacarpal phalangeal joint. Full extension of the metacarpal phalangeal joint and the interphalangeal joints was accomplished. The long and ring fingers were then buddy taped gauze between the fingers.  Jerry Douglas will return to our office for followup evaluation in 4 days.

## 2012-09-19 NOTE — H&P (Signed)
Jerry Douglas is an 57 y.o. male.   Chief Complaint: 57 year old right-hand dominant retired [employer] worker with locked right long finger metacarpal phalangeal joint in 40 of flexion. HPI: Jerry Douglas was working in his garden when he sustained an unusual injury to his right hand. He was grasping a stick with great pressure in a fist position and suddenly had pain in his right long finger metacarpophalangeal joint. He was immediately unable to extend the long finger metacarpal phalangeal joint beyond a 40 flexion contracture. He was referred to our office for clinical evaluation by his primary care physician. He attempted A. intra-articular block with 2% plain lidocaine. Despite complete anesthesia we were unable to unlock the joint due to tension in his forearm flexor and extensor muscles. We recommended reexamination of his hand under a supraclavicular block anesthesia to obtain complete muscle relaxation at this time. X ray in our office revealed significant osteophyte formation on the palmar and ulnar aspect of the right long finger metacarpal head.  Past Medical History  Diagnosis Date  . Hypertension   . BPH (benign prostatic hyperplasia)   . Diverticulosis   . Hemorrhoids   . Wears glasses     Past Surgical History  Procedure Laterality Date  . Esophagogastroduodenoscopy  07/13/2011    Procedure: ESOPHAGOGASTRODUODENOSCOPY (EGD);  Surgeon: Willis Modena, MD;  Location: [facility];  Service: Endoscopy;  Laterality: N/A;  . Upper gastrointestinal endoscopy  08/15/11  . Esophagogastroduodenoscopy  08/15/2011    Procedure: ESOPHAGOGASTRODUODENOSCOPY (EGD);  Surgeon: Willis Modena, MD;  Location: [facility];  Service: Endoscopy;  Laterality: N/A;  . Balloon dilation  08/15/2011    Procedure: BALLOON DILATION;  Surgeon: Willis Modena, MD;  Location: [facility];  Service: Endoscopy;  Laterality: N/A;  . Hernia repair  1994    lt and rt ing     Family History  Problem  Relation Age of Onset  . Hypertension Mother   . Stroke Father   . Diabetes Father   . Hypertension Father   . Hyperlipidemia Father   . Diabetes Sister   . Hypertension Sister   . Hyperlipidemia Sister   . Colon cancer Neg Hx    Social History:  reports that he quit smoking about 4 years ago. He has never used smokeless tobacco. He reports that he drinks about 4.2 ounces of alcohol per week. He reports that he does not use illicit drugs.  Allergies: No Known Allergies  Medications Prior to Admission  Medication Sig Dispense Refill  . buPROPion (WELLBUTRIN SR) 150 MG 12 hr tablet Take 1 tablet (150 mg total) by mouth 1 day or 1 dose.  90 tablet  3  . hydrochlorothiazide (MICROZIDE) 12.5 MG capsule Take 1 capsule (12.5 mg total) by mouth daily.  90 capsule  3  . Multiple Vitamins-Minerals (MULTIVITAMIN WITH MINERALS) tablet Take 1 tablet by mouth daily.        . tadalafil (CIALIS) 10 MG tablet Take 1 tablet (10 mg total) by mouth daily as needed.  10 tablet  5  . vitamin E 1000 UNIT capsule Take 1,000 Units by mouth daily.          Results for orders placed during the hospital encounter of 09/19/12 (from the past 48 hour(s))  POCT I-STAT, CHEM 8     Status: Abnormal   Collection Time    09/19/12 12:29 PM      Result Value Range   Sodium 137  135 - 145 mEq/L   Potassium 3.8  3.5 - 5.1 mEq/L   Chloride 101  96 - 112 mEq/L   BUN 5 (*) 6 - 23 mg/dL   Creatinine, Ser 1.61  0.50 - 1.35 mg/dL   Glucose, Bld 94  70 - 99 mg/dL   Calcium, Ion 0.96 (*) 1.12 - 1.23 mmol/L   TCO2 26  0 - 100 mmol/L   Hemoglobin 16.3  13.0 - 17.0 g/dL   HCT 04.5  40.9 - 81.1 %   No results found.  Review of Systems  Constitutional: Negative.   HENT: Negative.   Eyes: Negative.   Cardiovascular: Negative.   Genitourinary: Negative.   Musculoskeletal: Negative.        History of bilateral early Dupuytren's palmar fibromatosis.  Skin: Negative.   Neurological: Negative.   Endo/Heme/Allergies:  Negative.   Psychiatric/Behavioral: Negative.     Blood pressure 128/82, pulse 75, temperature 98.4 F (36.9 C), temperature source Oral, resp. rate 16, height 5\' 5"  (1.651 m), weight 46.72 kg (103 lb), SpO2 99.00%. Physical Exam  Constitutional: He is oriented to person, place, and time. He appears well-developed and well-nourished.  HENT:  Head: Atraumatic.  Eyes: Conjunctivae and EOM are normal. Pupils are equal, round, and reactive to light.  Neck: Normal range of motion. Neck supple.  Cardiovascular: Normal rate and regular rhythm.   Respiratory: Effort normal and breath sounds normal.  GI: Soft. Bowel sounds are normal.  Musculoskeletal:  24 hour history of flexion locked posture of right long finger metacarpal phalangeal joint. No impairment of the interphalangeal joint motion. Normal motor and sensory function.  Neurological: He is alert and oriented to person, place, and time. He has normal reflexes.  Skin: Skin is warm and dry.  Psychiatric: He has a normal mood and affect. His behavior is normal. Judgment and thought content normal.     Assessment/Plan Right long finger metacarpal phalangeal joint locked in flexion due to palm R. ulnar osteophyte trapping capsule. Examination of right long finger under supraclavicular block anesthesia at [facility] with anticipated closed manipulation of the long finger MP joint versus open reduction.  Samah Lapiana JR,Avriana Joo V 09/19/2012, 2:04 PM

## 2012-09-19 NOTE — Progress Notes (Signed)
Pt discharged home with friend.  Instructions on block care given, sling applied. Instructions to call dr sypher if any problems or concerns arise. Pain rx given to person with pt.  Pt to see Dr Teressa Senter this coming Monday. Pt ambulatory to car with no distress.

## 2012-09-19 NOTE — Progress Notes (Signed)
Assisted Dr. Crews with right, ultrasound guided, supraclavicular block. Side rails up, monitors on throughout procedure. See vital signs in flow sheet. Tolerated Procedure well. 

## 2012-09-20 NOTE — Progress Notes (Signed)
Dr Teressa Senter came to see pt pre op and manipulated pt's finger. Dr Teressa Senter then decided that a surgical repair was not needed and that immobilizing the finger would be sufficient IV was D/Ced and pt was discharged at 220PM to home with Friend.

## 2012-11-01 ENCOUNTER — Other Ambulatory Visit: Payer: Self-pay | Admitting: Family Medicine

## 2012-11-14 ENCOUNTER — Encounter: Payer: Self-pay | Admitting: Family Medicine

## 2012-11-14 ENCOUNTER — Ambulatory Visit (INDEPENDENT_AMBULATORY_CARE_PROVIDER_SITE_OTHER): Payer: Federal, State, Local not specified - PPO | Admitting: Family Medicine

## 2012-11-14 VITALS — BP 124/80 | HR 78 | Ht 65.0 in | Wt 105.0 lb

## 2012-11-14 DIAGNOSIS — Z79899 Other long term (current) drug therapy: Secondary | ICD-10-CM

## 2012-11-14 DIAGNOSIS — R195 Other fecal abnormalities: Secondary | ICD-10-CM

## 2012-11-14 DIAGNOSIS — N400 Enlarged prostate without lower urinary tract symptoms: Secondary | ICD-10-CM

## 2012-11-14 DIAGNOSIS — N529 Male erectile dysfunction, unspecified: Secondary | ICD-10-CM

## 2012-11-14 DIAGNOSIS — F341 Dysthymic disorder: Secondary | ICD-10-CM

## 2012-11-14 DIAGNOSIS — I10 Essential (primary) hypertension: Secondary | ICD-10-CM

## 2012-11-14 DIAGNOSIS — N486 Induration penis plastica: Secondary | ICD-10-CM

## 2012-11-14 DIAGNOSIS — Z0000 Encounter for general adult medical examination without abnormal findings: Secondary | ICD-10-CM

## 2012-11-14 LAB — CBC WITH DIFFERENTIAL/PLATELET
Basophils Relative: 1 % (ref 0–1)
Eosinophils Absolute: 0.3 10*3/uL (ref 0.0–0.7)
Eosinophils Relative: 5 % (ref 0–5)
Lymphs Abs: 1.4 10*3/uL (ref 0.7–4.0)
MCH: 34.2 pg — ABNORMAL HIGH (ref 26.0–34.0)
MCHC: 35.6 g/dL (ref 30.0–36.0)
MCV: 96 fL (ref 78.0–100.0)
Neutrophils Relative %: 63 % (ref 43–77)
Platelets: 240 10*3/uL (ref 150–400)
RBC: 4.77 MIL/uL (ref 4.22–5.81)
RDW: 13.5 % (ref 11.5–15.5)

## 2012-11-14 LAB — HEMOCCULT GUIAC POC 1CARD (OFFICE)

## 2012-11-14 NOTE — Progress Notes (Signed)
  Subjective:    Patient ID: Jerry Douglas, male    DOB: [birth year], 57 y.o.   MRN: [id number]  HPI He is here for complete examination. He is now retired and enjoying his retirement. His home life is stable and going quite well. He did have recent hand surgery and this is going well. He does have Peyronnie disease and vitamin E has helped that. Cialis is still needed. He has weaned himself off of Wellbutrin and is doing well. He is having no difficulty with his BPH. He did have a PSA done last year.   Review of Systems Negative except as above    Objective:   Physical Exam BP 124/80  Pulse 78  Ht 5\' 5"  (1.651 m)  Wt 105 lb (47.628 kg)  BMI 17.47 kg/m2  General Appearance:    Alert, cooperative, no distress, appears stated age  Head:    Normocephalic, without obvious abnormality, atraumatic  Eyes:    PERRL, conjunctiva/corneas clear, EOM's intact, fundi    benign  Ears:    Normal TM's and external ear canals  Nose:   Nares normal, mucosa normal, no drainage or sinus   tenderness  Throat:   Lips, mucosa, and tongue normal; teeth and gums normal  Neck:   Supple, no lymphadenopathy;  thyroid:  no   enlargement/tenderness/nodules; no carotid   bruit or JVD  Back:    Spine nontender, no curvature, ROM normal, no CVA     tenderness  Lungs:     Clear to auscultation bilaterally without wheezes, rales or     ronchi; respirations unlabored  Chest Wall:    No tenderness or deformity   Heart:    Regular rate and rhythm, S1 and S2 normal, no murmur, rub   or gallop  Breast Exam:    No chest wall tenderness, masses or gynecomastia  Abdomen:     Soft, non-tender, nondistended, normoactive bowel sounds,    no masses, no hepatosplenomegaly     Rectal:    Normal sphincter tone, no masses or tenderness; guaiac positive stool.  Prostate smooth, no nodules, not enlarged.  Extremities:   No clubbing, cyanosis or edema  Pulses:   2+ and symmetric all extremities  Skin:   Skin color, texture, turgor  normal, no rashes or lesions  Lymph nodes:   Cervical, supraclavicular, and axillary nodes normal  Neurologic:   CNII-XII intact, normal strength, sensation and gait; reflexes 2+ and symmetric throughout          Psych:   Normal mood, affect, hygiene and grooming.          Assessment & Plan:  Routine general medical examination at a health care facility - Plan: CBC with Differential, Comprehensive metabolic panel, Lipid panel, Hemoccult - 1 Card (office)  Hypertension  BPH (benign prostatic hyperplasia)  ED (erectile dysfunction)  Dysthymia  Peyronie's disease  Encounter for long-term (current) use of other medications - Plan: CBC with Differential, Comprehensive metabolic panel, Lipid panel  Stool guaiac positive - Plan: Ambulatory referral to Gastroenterology  he will continue on his present medications. If he has difficulty with return of his psychological symptoms, he will call me.

## 2012-11-14 NOTE — Patient Instructions (Signed)
The next time he started getting stressed use the serenity prayer

## 2012-11-15 LAB — COMPREHENSIVE METABOLIC PANEL
ALT: 10 U/L (ref 0–53)
Alkaline Phosphatase: 60 U/L (ref 39–117)
CO2: 28 mEq/L (ref 19–32)
Creat: 0.64 mg/dL (ref 0.50–1.35)
Sodium: 136 mEq/L (ref 135–145)
Total Bilirubin: 0.5 mg/dL (ref 0.3–1.2)

## 2012-11-15 LAB — LIPID PANEL
Cholesterol: 190 mg/dL (ref 0–200)
LDL Cholesterol: 105 mg/dL — ABNORMAL HIGH (ref 0–99)
Total CHOL/HDL Ratio: 2.6 Ratio
Triglycerides: 63 mg/dL (ref <150)
VLDL: 13 mg/dL (ref 0–40)

## 2012-11-15 NOTE — Progress Notes (Signed)
Quick Note:  CALLED PT TO INFORM HIM OF NORMAL LABS TALKED WITH BART AND HE VERBALIZED UNDERSTANDING ______

## 2012-12-19 ENCOUNTER — Encounter: Payer: Self-pay | Admitting: Family Medicine

## 2013-01-02 ENCOUNTER — Other Ambulatory Visit: Payer: Self-pay

## 2013-11-17 ENCOUNTER — Ambulatory Visit (INDEPENDENT_AMBULATORY_CARE_PROVIDER_SITE_OTHER): Payer: Federal, State, Local not specified - PPO | Admitting: Family Medicine

## 2013-11-17 ENCOUNTER — Encounter: Payer: Self-pay | Admitting: Family Medicine

## 2013-11-17 VITALS — BP 130/90 | HR 86 | Ht 65.0 in | Wt 106.0 lb

## 2013-11-17 DIAGNOSIS — I10 Essential (primary) hypertension: Secondary | ICD-10-CM

## 2013-11-17 DIAGNOSIS — Z0000 Encounter for general adult medical examination without abnormal findings: Secondary | ICD-10-CM

## 2013-11-17 DIAGNOSIS — N486 Induration penis plastica: Secondary | ICD-10-CM

## 2013-11-17 DIAGNOSIS — N400 Enlarged prostate without lower urinary tract symptoms: Secondary | ICD-10-CM

## 2013-11-17 LAB — POCT URINALYSIS DIPSTICK
Bilirubin, UA: NEGATIVE
GLUCOSE UA: NEGATIVE
Ketones, UA: NEGATIVE
Leukocytes, UA: NEGATIVE
Nitrite, UA: NEGATIVE
PH UA: 7
Protein, UA: NEGATIVE
RBC UA: NEGATIVE
UROBILINOGEN UA: NEGATIVE

## 2013-11-17 MED ORDER — HYDROCHLOROTHIAZIDE 12.5 MG PO CAPS: ORAL_CAPSULE | ORAL | Status: DC

## 2013-11-17 MED ORDER — TADALAFIL 10 MG PO TABS: ORAL_TABLET | ORAL | Status: DC

## 2013-11-17 NOTE — Progress Notes (Signed)
   Subjective:    Patient ID: Jerry Douglas, male    DOB: 12/29/1955, 58 y.o.   MRN: [id number]  HPI He is here for complete examination. He would like a refill on his Cialis for treatment of underlying BPH. The Cialis is working quite well to help prevent difficulty he has had in the past with dribbling at the end of urination, or and nocturia. He states that he is not having much difficulty with his Peyronie's disease. He continues on HCTZ for his blood pressure. He has no other concerns or complaints. He is now retired and this is going quite well. He is in a 38 year relationship which is going well. They have no plans to get married.  Review of Systems  All other systems reviewed and are negative.      Objective:   Physical Exam BP 130/90  Pulse 86  Ht 5\' 5"  (1.651 m)  Wt 106 lb (48.081 kg)  BMI 17.64 kg/m2  SpO2 98%  General Appearance:    Alert, cooperative, no distress, appears stated age  Head:    Normocephalic, without obvious abnormality, atraumatic  Eyes:    PERRL, conjunctiva/corneas clear, EOM's intact, fundi    benign  Ears:    Normal TM's and external ear canals  Nose:   Nares normal, mucosa normal, no drainage or sinus   tenderness  Throat:   Lips, mucosa, and tongue normal; teeth and gums normal  Neck:   Supple, no lymphadenopathy;  thyroid:  no   enlargement/tenderness/nodules; no carotid   bruit or JVD  Back:    Spine nontender, no curvature, ROM normal, no CVA     tenderness  Lungs:     Clear to auscultation bilaterally without wheezes, rales or     ronchi; respirations unlabored  Chest Wall:    No tenderness or deformity   Heart:    Regular rate and rhythm, S1 and S2 normal, no murmur, rub   or gallop  Breast Exam:    No chest wall tenderness, masses or gynecomastia  Abdomen:     Soft, non-tender, nondistended, normoactive bowel sounds,    no masses, no hepatosplenomegaly        Extremities:   No clubbing, cyanosis or edema  Pulses:   2+ and symmetric all  extremities  Skin:   Skin color, texture, turgor normal, no rashes or lesions  Lymph nodes:   Cervical, supraclavicular, and axillary nodes normal  Neurologic:   CNII-XII intact, normal strength, sensation and gait; reflexes 2+ and symmetric throughout          Psych:   Normal mood, affect, hygiene and grooming.          Assessment & Plan:  Peyronie's disease  Essential hypertension - Plan: hydrochlorothiazide (MICROZIDE) 12.5 MG capsule  BPH (benign prostatic hyperplasia) - Plan: tadalafil (CIALIS) 10 MG tablet

## 2013-11-18 ENCOUNTER — Telehealth: Payer: Self-pay | Admitting: Family Medicine

## 2013-11-18 MED ORDER — TADALAFIL 5 MG PO TABS: 5.0000 mg | ORAL_TABLET | Freq: Every day | ORAL | Status: DC | PRN

## 2013-11-18 NOTE — Telephone Encounter (Signed)
Faxed P.A. Cialis.  Also please verify last Rx to [facility] says 10mg , shouldn't this be 5mg  daily.  Please correct and resend to [facility] for 90 days

## 2013-11-20 ENCOUNTER — Telehealth: Payer: Self-pay | Admitting: Family Medicine

## 2013-11-20 NOTE — Telephone Encounter (Signed)
LM

## 2013-12-18 ENCOUNTER — Encounter: Payer: Self-pay | Admitting: Family Medicine

## 2014-02-27 DIAGNOSIS — D039 Melanoma in situ, unspecified: Secondary | ICD-10-CM

## 2014-02-27 HISTORY — DX: Melanoma in situ, unspecified: D03.9

## 2014-08-27 ENCOUNTER — Encounter: Payer: Self-pay | Admitting: Internal Medicine

## 2014-08-28 ENCOUNTER — Encounter: Payer: Self-pay | Admitting: Family Medicine

## 2014-11-12 ENCOUNTER — Encounter: Payer: Self-pay | Admitting: Family Medicine

## 2014-11-19 ENCOUNTER — Telehealth: Payer: Self-pay | Admitting: Family Medicine

## 2014-11-19 ENCOUNTER — Ambulatory Visit (INDEPENDENT_AMBULATORY_CARE_PROVIDER_SITE_OTHER): Payer: Federal, State, Local not specified - PPO | Admitting: Family Medicine

## 2014-11-19 ENCOUNTER — Encounter: Payer: Self-pay | Admitting: Family Medicine

## 2014-11-19 VITALS — BP 130/82 | HR 80 | Ht 65.0 in | Wt 107.0 lb

## 2014-11-19 DIAGNOSIS — I10 Essential (primary) hypertension: Secondary | ICD-10-CM | POA: Diagnosis not present

## 2014-11-19 DIAGNOSIS — Z0000 Encounter for general adult medical examination without abnormal findings: Secondary | ICD-10-CM | POA: Diagnosis not present

## 2014-11-19 DIAGNOSIS — Z23 Encounter for immunization: Secondary | ICD-10-CM | POA: Diagnosis not present

## 2014-11-19 DIAGNOSIS — M720 Palmar fascial fibromatosis [Dupuytren]: Secondary | ICD-10-CM | POA: Diagnosis not present

## 2014-11-19 DIAGNOSIS — N486 Induration penis plastica: Secondary | ICD-10-CM

## 2014-11-19 DIAGNOSIS — Z85820 Personal history of malignant melanoma of skin: Secondary | ICD-10-CM | POA: Diagnosis not present

## 2014-11-19 DIAGNOSIS — N400 Enlarged prostate without lower urinary tract symptoms: Secondary | ICD-10-CM

## 2014-11-19 DIAGNOSIS — Z86006 Personal history of melanoma in-situ: Secondary | ICD-10-CM | POA: Insufficient documentation

## 2014-11-19 LAB — POCT URINALYSIS DIPSTICK
Bilirubin, UA: NEGATIVE
Glucose, UA: NEGATIVE
Ketones, UA: NEGATIVE
Leukocytes, UA: NEGATIVE
NITRITE UA: NEGATIVE
PH UA: 7
Protein, UA: NEGATIVE
RBC UA: NEGATIVE
Spec Grav, UA: 1.015
UROBILINOGEN UA: NEGATIVE

## 2014-11-19 LAB — CBC WITH DIFFERENTIAL/PLATELET
BASOS PCT: 1 % (ref 0–1)
Basophils Absolute: 0.1 10*3/uL (ref 0.0–0.1)
Eosinophils Absolute: 0.2 10*3/uL (ref 0.0–0.7)
Eosinophils Relative: 2 % (ref 0–5)
HCT: 48.2 % (ref 39.0–52.0)
HEMOGLOBIN: 17.5 g/dL — AB (ref 13.0–17.0)
Lymphocytes Relative: 13 % (ref 12–46)
Lymphs Abs: 1.2 10*3/uL (ref 0.7–4.0)
MCH: 34.2 pg — ABNORMAL HIGH (ref 26.0–34.0)
MCHC: 36.3 g/dL — AB (ref 30.0–36.0)
MCV: 94.1 fL (ref 78.0–100.0)
MONO ABS: 0.6 10*3/uL (ref 0.1–1.0)
MPV: 10.6 fL (ref 8.6–12.4)
Monocytes Relative: 7 % (ref 3–12)
NEUTROS ABS: 6.9 10*3/uL (ref 1.7–7.7)
Neutrophils Relative %: 77 % (ref 43–77)
Platelets: 206 10*3/uL (ref 150–400)
RBC: 5.12 MIL/uL (ref 4.22–5.81)
RDW: 13.3 % (ref 11.5–15.5)
WBC: 8.9 10*3/uL (ref 4.0–10.5)

## 2014-11-19 LAB — LIPID PANEL
Cholesterol: 174 mg/dL (ref 125–200)
HDL: 63 mg/dL (ref >=40)
LDL CALC: 90 mg/dL (ref <130)
Total CHOL/HDL Ratio: 2.8 Ratio (ref <=5.0)
Triglycerides: 103 mg/dL (ref <150)
VLDL: 21 mg/dL (ref <30)

## 2014-11-19 LAB — COMPREHENSIVE METABOLIC PANEL
ALBUMIN: 4.2 g/dL (ref 3.6–5.1)
ALT: 16 U/L (ref 9–46)
AST: 20 U/L (ref 10–35)
Alkaline Phosphatase: 64 U/L (ref 40–115)
BILIRUBIN TOTAL: 0.6 mg/dL (ref 0.2–1.2)
BUN: 7 mg/dL (ref 7–25)
CO2: 28 mmol/L (ref 20–31)
CREATININE: 0.56 mg/dL — AB (ref 0.70–1.25)
Calcium: 9.4 mg/dL (ref 8.6–10.3)
Chloride: 97 mmol/L — ABNORMAL LOW (ref 98–110)
GLUCOSE: 91 mg/dL (ref 65–99)
Potassium: 3.9 mmol/L (ref 3.5–5.3)
SODIUM: 135 mmol/L (ref 135–146)
Total Protein: 6.9 g/dL (ref 6.1–8.1)

## 2014-11-19 MED ORDER — HYDROCHLOROTHIAZIDE 12.5 MG PO CAPS: ORAL_CAPSULE | ORAL | Status: DC

## 2014-11-19 MED ORDER — TADALAFIL 5 MG PO TABS: 5.0000 mg | ORAL_TABLET | Freq: Every day | ORAL | Status: DC | PRN

## 2014-11-19 NOTE — Progress Notes (Signed)
   Subjective:    Patient ID: Jerry Douglas, male    DOB: 1955/05/28, 59 y.o.   MRN: [id number]  HPI He is here for complete examination. He was diagnosed with melanoma in situ and is now getting regular skin checks. This was earlier this year. He does have erroneous disease as well as a Dupuytren's contracture. Both of these presently not causing a great deal of difficulty. He does have underlying BPH and is getting good results with Cialis. He continues on his high blood pressure medication. He has been retired for 3 years but does keep himself busy.Is not complaining of chest pain, shortness of breath, GI issues Family and social history as well as immunizations and maintenance was reviewed.   Review of Systems  All other systems reviewed and are negative.      Objective:   Physical Exam BP 130/82 mmHg  Pulse 80  Ht 5\' 5"  (1.651 m)  Wt 107 lb (48.535 kg)  BMI 17.81 kg/m2  SpO2 98%  General Appearance:    Alert, cooperative, no distress, appears stated age  Head:    Normocephalic, without obvious abnormality, atraumatic  Eyes:    PERRL, conjunctiva/corneas clear, EOM's intact, fundi    benign  Ears:    Normal TM's and external ear canals  Nose:   Nares normal, mucosa normal, no drainage or sinus   tenderness  Throat:   Lips, mucosa, and tongue normal; teeth and gums normal  Neck:   Supple, no lymphadenopathy;  thyroid:  no   enlargement/tenderness/nodules; no carotid   bruit or JVD  Back:    Spine nontender, no curvature, ROM normal, no CVA     tenderness  Lungs:     Clear to auscultation bilaterally without wheezes, rales or     ronchi; respirations unlabored  Chest Wall:    No tenderness or deformity   Heart:    Regular rate and rhythm, S1 and S2 normal, no murmur, rub   or gallop  Breast Exam:    No chest wall tenderness, masses or gynecomastia  Abdomen:     Soft, non-tender, nondistended, normoactive bowel sounds,    no masses, no hepatosplenomegaly        Extremities:    No clubbing, cyanosis or edema  Pulses:   2+ and symmetric all extremities  Skin:   Skin color, texture, turgor normal, no rashes or lesions  Lymph nodes:   Cervical, supraclavicular, and axillary nodes normal  Neurologic:   CNII-XII intact, normal strength, sensation and gait; reflexes 2+ and symmetric throughout          Psych:   Normal mood, affect, hygiene and grooming.          Assessment & Plan:  Routine general medical examination at a health care facility - Plan: CBC with Differential/Platelet, Comprehensive metabolic panel, Lipid panel, PSA  Need for shingles vaccine - Plan: Varicella-zoster vaccine subcutaneous  Essential hypertension - Plan: hydrochlorothiazide (MICROZIDE) 12.5 MG capsule, POCT Urinalysis Dipstick  History of melanoma in situ  Peyronie's disease  BPH (benign prostatic hyperplasia) - Plan: tadalafil (CIALIS) 5 MG tablet  Dupuytren's contracture of right hand I encouraged him to continue to take good care of himself.

## 2014-11-20 LAB — PSA: PSA: 0.35 ng/mL (ref <=4.00)

## 2014-11-23 NOTE — Telephone Encounter (Signed)
P.A. Approved til 11/19/15, Pt informed, he ordered online at mail order

## 2015-06-01 DIAGNOSIS — K080 Exfoliation of teeth due to systemic causes: Secondary | ICD-10-CM | POA: Diagnosis not present

## 2015-06-02 DIAGNOSIS — D485 Neoplasm of uncertain behavior of skin: Secondary | ICD-10-CM | POA: Diagnosis not present

## 2015-06-02 DIAGNOSIS — Z872 Personal history of diseases of the skin and subcutaneous tissue: Secondary | ICD-10-CM | POA: Diagnosis not present

## 2015-06-02 DIAGNOSIS — L821 Other seborrheic keratosis: Secondary | ICD-10-CM | POA: Diagnosis not present

## 2015-06-02 DIAGNOSIS — L812 Freckles: Secondary | ICD-10-CM | POA: Diagnosis not present

## 2015-06-02 DIAGNOSIS — Z85820 Personal history of malignant melanoma of skin: Secondary | ICD-10-CM | POA: Diagnosis not present

## 2015-06-30 DIAGNOSIS — K080 Exfoliation of teeth due to systemic causes: Secondary | ICD-10-CM | POA: Diagnosis not present

## 2015-07-12 DIAGNOSIS — H5211 Myopia, right eye: Secondary | ICD-10-CM | POA: Diagnosis not present

## 2015-07-12 DIAGNOSIS — H2511 Age-related nuclear cataract, right eye: Secondary | ICD-10-CM | POA: Diagnosis not present

## 2015-07-12 DIAGNOSIS — H25811 Combined forms of age-related cataract, right eye: Secondary | ICD-10-CM | POA: Diagnosis not present

## 2015-09-02 ENCOUNTER — Emergency Department (HOSPITAL_COMMUNITY): Payer: Federal, State, Local not specified - PPO

## 2015-09-02 ENCOUNTER — Emergency Department (HOSPITAL_COMMUNITY)
Admission: EM | Admit: 2015-09-02 | Discharge: 2015-09-02 | Disposition: A | Discharge status: Home / Self Care | Payer: Federal, State, Local not specified - PPO | Attending: Emergency Medicine | Admitting: Emergency Medicine

## 2015-09-02 ENCOUNTER — Encounter (HOSPITAL_COMMUNITY): Payer: Self-pay

## 2015-09-02 DIAGNOSIS — Y929 Unspecified place or not applicable: Secondary | ICD-10-CM | POA: Diagnosis not present

## 2015-09-02 DIAGNOSIS — Y939 Activity, unspecified: Secondary | ICD-10-CM | POA: Insufficient documentation

## 2015-09-02 DIAGNOSIS — S129XXA Fracture of neck, unspecified, initial encounter: Secondary | ICD-10-CM

## 2015-09-02 DIAGNOSIS — S0990XA Unspecified injury of head, initial encounter: Secondary | ICD-10-CM | POA: Diagnosis present

## 2015-09-02 DIAGNOSIS — Z87891 Personal history of nicotine dependence: Secondary | ICD-10-CM | POA: Diagnosis not present

## 2015-09-02 DIAGNOSIS — W010XXA Fall on same level from slipping, tripping and stumbling without subsequent striking against object, initial encounter: Secondary | ICD-10-CM

## 2015-09-02 DIAGNOSIS — I10 Essential (primary) hypertension: Secondary | ICD-10-CM | POA: Insufficient documentation

## 2015-09-02 DIAGNOSIS — S12030A Displaced posterior arch fracture of first cervical vertebra, initial encounter for closed fracture: Secondary | ICD-10-CM | POA: Diagnosis not present

## 2015-09-02 DIAGNOSIS — W01118A Fall on same level from slipping, tripping and stumbling with subsequent striking against other sharp object, initial encounter: Secondary | ICD-10-CM | POA: Insufficient documentation

## 2015-09-02 DIAGNOSIS — S0081XA Abrasion of other part of head, initial encounter: Secondary | ICD-10-CM | POA: Diagnosis not present

## 2015-09-02 DIAGNOSIS — Y999 Unspecified external cause status: Secondary | ICD-10-CM | POA: Diagnosis not present

## 2015-09-02 DIAGNOSIS — S0003XA Contusion of scalp, initial encounter: Secondary | ICD-10-CM | POA: Diagnosis not present

## 2015-09-02 DIAGNOSIS — S12390A Other displaced fracture of fourth cervical vertebra, initial encounter for closed fracture: Secondary | ICD-10-CM | POA: Diagnosis not present

## 2015-09-02 MED ORDER — BACITRACIN ZINC 500 UNIT/GM EX OINT
TOPICAL_OINTMENT | CUTANEOUS | Status: AC
  Filled 2015-09-02: qty 2.7

## 2015-09-02 MED ORDER — HYDROCODONE-ACETAMINOPHEN 5-325 MG PO TABS
2.0000 | ORAL_TABLET | Freq: Once | ORAL | Status: AC
  Administered 2015-09-02: 2 via ORAL
  Filled 2015-09-02: qty 2

## 2015-09-02 MED ORDER — OXYCODONE-ACETAMINOPHEN 5-325 MG PO TABS: 1.0000 | ORAL_TABLET | Freq: Four times a day (QID) | ORAL | Status: DC | PRN

## 2015-09-02 NOTE — ED Notes (Signed)
Pt c/o head injury/laceration and posterior neck spasms r/t falling down 3 steps.  Pain score 9/10.  Anterior head lac noted.  C collar placed.  Denies numbness and tingling.

## 2015-09-02 NOTE — ED Provider Notes (Signed)
CSN: [id number]     Arrival date & time 09/02/15  1337 History   First MD Initiated Contact with Patient 09/02/15 1410     Chief Complaint  Patient presents with  . Fall  . Head Injury  . Neck Pain     (Consider location/radiation/quality/duration/timing/severity/associated sxs/prior Treatment) Patient is a 60 y.o. male presenting with fall, head injury, and neck pain. The history is provided by the patient.  Fall Pertinent negatives include no chest pain, no abdominal pain and no shortness of breath.  Head Injury Associated symptoms: neck pain   Associated symptoms: no numbness and no vomiting   Neck Pain Associated symptoms: no chest pain, no fever, no numbness and no weakness   Patient c/o pain in head/neck post fall/hitting head shortly pta today. Patient was on front porch, flip flops were wet, lost balance, fell back down 3 steps, hit head. Dazed. No loc. Abrasion to forehead. C/o headache and neck pain post fall. Pain moderate, constant, non radiating, Was asymptomatic prior to fall, no preceding headache, dizziness/lightheadedness, or other symptom. Also c/o bending back toe nail on right foot. Ambulatory since fall. Denies other pain or injury. Indicates tetanus up to date.      Past Medical History  Diagnosis Date  . Hypertension   . BPH (benign prostatic hyperplasia)   . Diverticulosis   . Hemorrhoids   . Wears glasses   . Melanoma in situ [facility]) 2016    Mid upper back   Past Surgical History  Procedure Laterality Date  . Esophagogastroduodenoscopy  07/13/2011    Procedure: ESOPHAGOGASTRODUODENOSCOPY (EGD);  Surgeon: Arta Silence, MD;  Location: [facility];  Service: Endoscopy;  Laterality: N/A;  . Upper gastrointestinal endoscopy  08/15/11  . Esophagogastroduodenoscopy  08/15/2011    Procedure: ESOPHAGOGASTRODUODENOSCOPY (EGD);  Surgeon: Arta Silence, MD;  Location: [facility];  Service: Endoscopy;  Laterality: N/A;  . Balloon dilation  08/15/2011   Procedure: BALLOON DILATION;  Surgeon: Arta Silence, MD;  Location: [facility];  Service: Endoscopy;  Laterality: N/A;  . Hernia repair  1994    lt and rt ing   . Biopsy eye muscle    . Cataract extraction Right    Family History  Problem Relation Age of Onset  . Hypertension Mother   . Stroke Father   . Diabetes Father   . Hypertension Father   . Hyperlipidemia Father   . Diabetes Sister   . Hypertension Sister   . Hyperlipidemia Sister   . Colon cancer Neg Hx    Social History  Substance Use Topics  . Smoking status: Former Smoker    Quit date: 02/28/2008  . Smokeless tobacco: Never Used  . Alcohol Use: 4.2 oz/week    7 Cans of beer per week     Comment: 1 can of beer/day    Review of Systems  Constitutional: Negative for fever.  HENT: Negative for sore throat.   Eyes: Negative for visual disturbance.  Respiratory: Negative for shortness of breath.   Cardiovascular: Negative for chest pain.  Gastrointestinal: Negative for vomiting and abdominal pain.  Genitourinary: Negative for flank pain.  Musculoskeletal: Positive for neck pain. Negative for back pain.  Skin: Positive for wound.  Neurological: Negative for weakness and numbness.  Hematological: Does not bruise/bleed easily.  Psychiatric/Behavioral: Negative for confusion.      Allergies  Clindamycin/lincomycin  Home Medications   Prior to Admission medications   Medication Sig Start Date End Date Taking? Authorizing Provider  hydrochlorothiazide (MICROZIDE) 12.5 MG capsule  TAKE 1 CAPSULE DAILY 11/19/14   Denita Lung, MD  Multiple Vitamins-Minerals (MULTIVITAMIN WITH MINERALS) tablet Take 1 tablet by mouth daily.      Historical Provider, MD  tadalafil (CIALIS) 5 MG tablet Take 1 tablet (5 mg total) by mouth daily as needed for erectile dysfunction. 11/19/14   Denita Lung, MD  vitamin E 1000 UNIT capsule Take 1,000 Units by mouth daily.      Historical Provider, MD   BP 145/86 mmHg  Pulse 80   Temp(Src) 98.2 F (36.8 C) (Oral)  Resp 18  SpO2 100% Physical Exam  Constitutional: He is oriented to person, place, and time. He appears well-developed and well-nourished. No distress.  HENT:  Mouth/Throat: Oropharynx is clear and moist.  Abrasion/contusion to forehead at hairline. tms normal. Facial bones/orbits grossly intact.   Eyes: Pupils are equal, round, and reactive to light.  Neck: No tracheal deviation present.  Patient in ccollar precautions.   Cardiovascular: Normal rate, regular rhythm, normal heart sounds and intact distal pulses.   Pulmonary/Chest: Effort normal and breath sounds normal. No accessory muscle usage. No respiratory distress. He exhibits no tenderness.  Abdominal: Soft. He exhibits no distension. There is no tenderness.  Musculoskeletal: Normal range of motion.  Mid to upper cervical tenderness, otherwise, CTLS spine, non tender, aligned, no step off. Good rom bil ext without pain or focal bony tenderness. Toe nail of right 2nd and 3rd toes sl raised/bloody, no bony tenderness.   Neurological: He is alert and oriented to person, place, and time.  Motor intact bil. Steady gait.   Skin: Skin is warm and dry. He is not diaphoretic.  Psychiatric: He has a normal mood and affect.  Nursing note and vitals reviewed.   ED Course  Procedures (including critical care time)  Ct Head Wo Contrast  09/02/2015  CLINICAL DATA:  Fall from porch with head injury with laceration and posterior neck spasm. EXAM: CT HEAD WITHOUT CONTRAST CT CERVICAL SPINE WITHOUT CONTRAST TECHNIQUE: Multidetector CT imaging of the head and cervical spine was performed following the standard protocol without intravenous contrast. Multiplanar CT image reconstructions of the cervical spine were also generated. COMPARISON:  None. FINDINGS: CT HEAD FINDINGS Small medial left frontal scalp contusion. No evidence of parenchymal hemorrhage or extra-axial fluid collection. No mass lesion, mass effect, or  midline shift. No CT evidence of acute infarction. Intracranial atherosclerosis. Cerebral volume is age appropriate. No ventriculomegaly. The visualized paranasal sinuses are essentially clear. The mastoid air cells are unopacified. No evidence of calvarial fracture. CT CERVICAL SPINE FINDINGS There are anterior and posterior bilateral C1 arch fractures, involving the bilateral C1 lateral masses. There is 7 mm lateral subluxation of the right C1 lateral mass relative to the right C2 lateral mass. There is 2 mm lateral subluxation of the left C1 lateral mass relative to the left C2 lateral mass. No widening at the atlantodental joint. No additional cervical spine fracture. No significant prevertebral soft tissue swelling. Slight reversal of the normal cervical lordosis. Marked degenerative disc disease in the mid to lower cervical spine, most prominent at C5-6 and C6-7. Marked bilateral facet arthropathy. No additional facet malalignment. Mild bilateral foraminal stenosis at C3-4. Mild-to-moderate foraminal stenosis on the left at C4-5. Moderate foraminal stenosis on the left at C5-6. Mild-to-moderate foraminal stenosis bilaterally at C6-7. Minimal 2 mm anterolisthesis at C3-4 and mild 3 mm anterolisthesis at C4-5. Visualized mastoid air cells appear clear. No evidence of intra-axial hemorrhage in the visualized brain. No gross cervical  canal hematoma. No significant pulmonary nodules at the visualized lung apices. No cervical adenopathy or other significant neck soft tissue abnormality. IMPRESSION: 1. Small medial left frontal scalp contusion. No evidence of acute intracranial abnormality. No evidence of calvarial fracture. 2. Bilateral anterior and posterior C1 arch fractures extending to the bilateral C1 lateral masses, with right greater than left lateral subluxation of the C1 lateral mass fracture fragments. 3. No additional cervical spine fracture. Marked degenerative disc disease and facet arthropathy in the  cervical spine as described. Mild spondylolisthesis at C3-4 and C4-5 is probably degenerative. These results were called by telephone at the time of interpretation on 09/02/2015 at 3:41 pm to Dr. Lajean Saver , who verbally acknowledged these results. Electronically Signed   By: Ilona Sorrel M.D.   On: 09/02/2015 15:43   Ct Cervical Spine Wo Contrast  09/02/2015  CLINICAL DATA:  Fall from porch with head injury with laceration and posterior neck spasm. EXAM: CT HEAD WITHOUT CONTRAST CT CERVICAL SPINE WITHOUT CONTRAST TECHNIQUE: Multidetector CT imaging of the head and cervical spine was performed following the standard protocol without intravenous contrast. Multiplanar CT image reconstructions of the cervical spine were also generated. COMPARISON:  None. FINDINGS: CT HEAD FINDINGS Small medial left frontal scalp contusion. No evidence of parenchymal hemorrhage or extra-axial fluid collection. No mass lesion, mass effect, or midline shift. No CT evidence of acute infarction. Intracranial atherosclerosis. Cerebral volume is age appropriate. No ventriculomegaly. The visualized paranasal sinuses are essentially clear. The mastoid air cells are unopacified. No evidence of calvarial fracture. CT CERVICAL SPINE FINDINGS There are anterior and posterior bilateral C1 arch fractures, involving the bilateral C1 lateral masses. There is 7 mm lateral subluxation of the right C1 lateral mass relative to the right C2 lateral mass. There is 2 mm lateral subluxation of the left C1 lateral mass relative to the left C2 lateral mass. No widening at the atlantodental joint. No additional cervical spine fracture. No significant prevertebral soft tissue swelling. Slight reversal of the normal cervical lordosis. Marked degenerative disc disease in the mid to lower cervical spine, most prominent at C5-6 and C6-7. Marked bilateral facet arthropathy. No additional facet malalignment. Mild bilateral foraminal stenosis at C3-4. Mild-to-moderate  foraminal stenosis on the left at C4-5. Moderate foraminal stenosis on the left at C5-6. Mild-to-moderate foraminal stenosis bilaterally at C6-7. Minimal 2 mm anterolisthesis at C3-4 and mild 3 mm anterolisthesis at C4-5. Visualized mastoid air cells appear clear. No evidence of intra-axial hemorrhage in the visualized brain. No gross cervical canal hematoma. No significant pulmonary nodules at the visualized lung apices. No cervical adenopathy or other significant neck soft tissue abnormality. IMPRESSION: 1. Small medial left frontal scalp contusion. No evidence of acute intracranial abnormality. No evidence of calvarial fracture. 2. Bilateral anterior and posterior C1 arch fractures extending to the bilateral C1 lateral masses, with right greater than left lateral subluxation of the C1 lateral mass fracture fragments. 3. No additional cervical spine fracture. Marked degenerative disc disease and facet arthropathy in the cervical spine as described. Mild spondylolisthesis at C3-4 and C4-5 is probably degenerative. These results were called by telephone at the time of interpretation on 09/02/2015 at 3:41 pm to Dr. Lajean Saver , who verbally acknowledged these results. Electronically Signed   By: Ilona Sorrel M.D.   On: 09/02/2015 15:43      I have personally reviewed and evaluated these images as part of my medical decision-making.   MDM   CT/imaging ordered.  Patient maintained in ccollar  precautions.  Wound cleaned, bacitracin and sterile dressing.   Patient placed in ASPEN collar, and kept in ccollar precautions at all time.  On recheck, denies any new c/o, no numbness or weakness. Pain improved.  Neurosurgery consulted - discussed pt with Dr Joya Salm who reviewed CT, and indicates to leave in ASPEN collar, to d/c to home, and f/u in office in 2 week - he tooks pts number and they will contact w appointment.  Discussed plan with pt.    Recheck abd soft nt. TLS spine nt. No new c/o.   Pt  currently appears stable for d/c.       Lajean Saver, MD 09/02/15 [phone]

## 2015-09-02 NOTE — Discharge Instructions (Signed)
It was our pleasure to provide your ER care today - we hope that you feel better.  Rest. Wear cervical collar at all times. No driving.   Use great care/caution to avoid falling.   Take motrin or aleve as need for pain. You may also take percocet as need for pain. No driving when taking percocet. Also, do not take tylenol or acetaminophen containing medication when taking percocet.   Follow up with neurosurgeon/neck specialist in 2 weeks - if you have not heard from their office by noon tomorrow, call office to arrange appointment - see referral.  Return to ER if worse, new symptoms, numbness/weakness, intractable pain, other concern.       Cervical Collar A cervical collar is a device that supports your chin and the back of your head. It is used after a severe neck injury to protect your head and neck. It does this by restricting the movement of the top part of your spine, which is located in your neck. A cervical collar may be used when you have:  A fractured neck.  Ligament damage.  A spinal cord injury. WHAT INSTRUCTIONS SHOULD I FOLLOW?  Wear the collar for as long as your health care provider instructs.  Follow your health care provider's instructions about how to put on and take off your collar.  Do not make your collar so tight that you feel pain or it is hard for you to breathe.  Do not remove the collar unless your health care provider says it is okay. Ask your health care provider if you can remove the collar for showering or eating or to apply ice.  Do not drive a car until your health care provider says it is okay.  Keep all follow-up visits as directed by your health care provider. This is important. Any delay in getting necessary care can keep your injury from healing properly.  Apply ice to the injured area:  Put ice in a plastic bag.  Place a towel between your skin and the bag.  Leave the ice on for 20 minutes, 2-3 times per day for the first 2 days.     This information is not intended to replace advice given to you by your health care provider. Make sure you discuss any questions you have with your health care provider.   Document Released: 11/06/2003 Document Revised: 03/06/2014 Document Reviewed: 09/22/2013 Elsevier Interactive Patient Education 2016 [place] in the [place]  Falls can cause injuries and can affect people from all age groups. There are many simple things that you can do to make your home safe and to help prevent falls. WHAT CAN I DO ON THE OUTSIDE OF MY HOME?  Regularly repair the edges of walkways and driveways and fix any cracks.  Remove high doorway thresholds.  Trim any shrubbery on the main path into your home.  Use bright outdoor lighting.  Clear walkways of debris and clutter, including tools and rocks.  Regularly check that handrails are securely fastened and in good repair. Both sides of any steps should have handrails.  Install guardrails along the edges of any raised decks or porches.  Have leaves, snow, and ice cleared regularly.  Use sand or salt on walkways during winter months.  In the garage, clean up any spills right away, including grease or oil spills. WHAT CAN I DO IN THE BATHROOM?  Use night lights.  Install grab bars by the toilet and in the tub and shower.  Do not use towel bars as grab bars.  Use non-skid mats or decals on the floor of the tub or shower.  If you need to sit down while you are in the shower, use a plastic, non-slip stool.[place] Kitchen  Keep the floor dry. Immediately clean up any water that spills on the floor.  Remove soap buildup in the tub or shower on a regular basis.  Attach bath mats securely with double-sided non-slip rug tape.  Remove throw rugs and other tripping hazards from the floor. WHAT CAN I DO IN THE BEDROOM?  Use night lights.  Make sure that a bedside light is easy to reach.  Do not use oversized bedding that drapes onto the  floor.  Have a firm chair that has side arms to use for getting dressed.  Remove throw rugs and other tripping hazards from the floor. WHAT CAN I DO IN THE KITCHEN?   Clean up any spills right away.  Avoid walking on wet floors.  Place frequently used items in easy-to-reach places.  If you need to reach for something above you, use a sturdy step stool that has a grab bar.  Keep electrical cables out of the way.  Do not use floor polish or wax that makes floors slippery. If you have to use wax, make sure that it is non-skid floor wax.  Remove throw rugs and other tripping hazards from the floor. WHAT CAN I DO IN THE STAIRWAYS?  Do not leave any items on the stairs.  Make sure that there are handrails on both sides of the stairs. Fix handrails that are broken or loose. Make sure that handrails are as long as the stairways.  Check any carpeting to make sure that it is firmly attached to the stairs. Fix any carpet that is loose or worn.  Avoid having throw rugs at the top or bottom of stairways, or secure the rugs with carpet tape to prevent them from moving.  Make sure that you have a light switch at the top of the stairs and the bottom of the stairs. If you do not have them, have them installed. WHAT ARE SOME OTHER FALL PREVENTION TIPS?  Wear closed-toe shoes that fit well and support your feet. Wear shoes that have rubber soles or low heels.  When you use a stepladder, make sure that it is completely opened and that the sides are firmly locked. Have someone hold the ladder while you are using it. Do not climb a closed stepladder.  Add color or contrast paint or tape to grab bars and handrails in your home. Place contrasting color strips on the first and last steps.  Use mobility aids as needed, such as canes, walkers, scooters, and crutches.  Turn on lights if it is dark. Replace any light bulbs that burn out.  Set up furniture so that there are clear paths. Keep the  furniture in the same spot.  Fix any uneven floor surfaces.  Choose a carpet design that does not hide the edge of steps of a stairway.  Be aware of any and all pets.  Review your medicines with your healthcare provider. Some medicines can cause dizziness or changes in blood pressure, which increase your risk of falling. Talk with your health care provider about other ways that you can decrease your risk of falls. This may include working with a physical therapist or trainer to improve your strength, balance, and endurance.   This information is not intended to replace advice given to  you by your health care provider. Make sure you discuss any questions you have with your health care provider.   Document Released: 02/03/2002 Document Revised: 06/30/2014 Document Reviewed: 03/20/2014 Elsevier Interactive Patient Education [employer].

## 2015-09-03 NOTE — ED Notes (Signed)
Patient had questions about his follow up instructions

## 2015-09-08 ENCOUNTER — Other Ambulatory Visit: Payer: Self-pay | Admitting: Neurosurgery

## 2015-09-08 DIAGNOSIS — M542 Cervicalgia: Secondary | ICD-10-CM

## 2015-09-09 ENCOUNTER — Other Ambulatory Visit: Payer: Self-pay | Admitting: Neurosurgery

## 2015-09-09 ENCOUNTER — Encounter: Payer: Self-pay | Admitting: Family Medicine

## 2015-09-09 ENCOUNTER — Ambulatory Visit (INDEPENDENT_AMBULATORY_CARE_PROVIDER_SITE_OTHER): Payer: Federal, State, Local not specified - PPO | Admitting: Family Medicine

## 2015-09-09 VITALS — BP 130/80 | HR 80 | Resp 18 | Wt 105.2 lb

## 2015-09-09 DIAGNOSIS — S12000A Unspecified displaced fracture of first cervical vertebra, initial encounter for closed fracture: Secondary | ICD-10-CM | POA: Diagnosis not present

## 2015-09-09 DIAGNOSIS — M542 Cervicalgia: Secondary | ICD-10-CM

## 2015-09-09 MED ORDER — HYDROCODONE-ACETAMINOPHEN 5-325 MG PO TABS: 1.0000 | ORAL_TABLET | Freq: Four times a day (QID) | ORAL | Status: DC | PRN

## 2015-09-09 NOTE — Progress Notes (Signed)
   Subjective:    Patient ID: Jerry Douglas, male    DOB: 17-Mar-1955, 60 y.o.   MRN: [id number]  HPI One week ago he tripped and fell and hit his head suffering a neck injury. He was seen in the emergency room. The x-ray showed a C1 fracture. Dr. Joya Salm was consulted. He is to follow-up with him within the next week. He is using a cervical collar. He complains only of neck pain but no numbness, tingling or weakness. He would like a refill on his pain medication.  Review of Systems     Objective:   Physical Exam Alert and in no distress otherwise not examined.       Assessment & Plan:  C1 cervical fracture, closed, initial encounter - Plan: HYDROcodone-acetaminophen (NORCO) 5-325 MG tablet  He was using oxycodone. I will switch him to hydrocodone. He states that his pain level is 6 out of 10.

## 2015-09-16 ENCOUNTER — Ambulatory Visit
Admission: RE | Admit: 2015-09-16 | Discharge: 2015-09-16 | Disposition: A | Discharge status: Home / Self Care | Payer: Federal, State, Local not specified - PPO | Source: Ambulatory Visit | Attending: Neurosurgery | Admitting: Neurosurgery

## 2015-09-16 DIAGNOSIS — M542 Cervicalgia: Secondary | ICD-10-CM | POA: Diagnosis not present

## 2015-09-16 DIAGNOSIS — S12090A Other displaced fracture of first cervical vertebra, initial encounter for closed fracture: Secondary | ICD-10-CM | POA: Diagnosis not present

## 2015-09-16 DIAGNOSIS — I10 Essential (primary) hypertension: Secondary | ICD-10-CM | POA: Diagnosis not present

## 2015-10-02 ENCOUNTER — Other Ambulatory Visit: Payer: Self-pay | Admitting: Neurosurgery

## 2015-10-02 DIAGNOSIS — S129XXD Fracture of neck, unspecified, subsequent encounter: Secondary | ICD-10-CM

## 2015-10-18 ENCOUNTER — Ambulatory Visit
Admission: RE | Admit: 2015-10-18 | Discharge: 2015-10-18 | Disposition: A | Discharge status: Home / Self Care | Payer: Federal, State, Local not specified - PPO | Source: Ambulatory Visit | Attending: Neurosurgery | Admitting: Neurosurgery

## 2015-10-18 DIAGNOSIS — S12040A Displaced lateral mass fracture of first cervical vertebra, initial encounter for closed fracture: Secondary | ICD-10-CM | POA: Diagnosis not present

## 2015-10-18 DIAGNOSIS — S129XXD Fracture of neck, unspecified, subsequent encounter: Secondary | ICD-10-CM

## 2015-10-21 ENCOUNTER — Other Ambulatory Visit: Payer: Self-pay | Admitting: Neurosurgery

## 2015-10-21 DIAGNOSIS — S129XXD Fracture of neck, unspecified, subsequent encounter: Secondary | ICD-10-CM

## 2015-11-15 ENCOUNTER — Ambulatory Visit
Admission: RE | Admit: 2015-11-15 | Discharge: 2015-11-15 | Disposition: A | Discharge status: Home / Self Care | Payer: Federal, State, Local not specified - PPO | Source: Ambulatory Visit | Attending: Neurosurgery | Admitting: Neurosurgery

## 2015-11-15 DIAGNOSIS — S129XXD Fracture of neck, unspecified, subsequent encounter: Secondary | ICD-10-CM

## 2015-11-15 DIAGNOSIS — S1201XD Stable burst fracture of first cervical vertebra, subsequent encounter for fracture with routine healing: Secondary | ICD-10-CM | POA: Diagnosis not present

## 2015-11-18 DIAGNOSIS — S129XXD Fracture of neck, unspecified, subsequent encounter: Secondary | ICD-10-CM | POA: Diagnosis not present

## 2015-11-22 ENCOUNTER — Ambulatory Visit (INDEPENDENT_AMBULATORY_CARE_PROVIDER_SITE_OTHER): Payer: Federal, State, Local not specified - PPO | Admitting: Family Medicine

## 2015-11-22 ENCOUNTER — Other Ambulatory Visit: Payer: Self-pay

## 2015-11-22 ENCOUNTER — Telehealth: Payer: Self-pay | Admitting: Family Medicine

## 2015-11-22 ENCOUNTER — Encounter: Payer: Self-pay | Admitting: Family Medicine

## 2015-11-22 VITALS — BP 130/90 | HR 81 | Ht 65.0 in | Wt 105.6 lb

## 2015-11-22 DIAGNOSIS — M720 Palmar fascial fibromatosis [Dupuytren]: Secondary | ICD-10-CM

## 2015-11-22 DIAGNOSIS — N486 Induration penis plastica: Secondary | ICD-10-CM

## 2015-11-22 DIAGNOSIS — Z0000 Encounter for general adult medical examination without abnormal findings: Secondary | ICD-10-CM | POA: Diagnosis not present

## 2015-11-22 DIAGNOSIS — I10 Essential (primary) hypertension: Secondary | ICD-10-CM

## 2015-11-22 DIAGNOSIS — Z86006 Personal history of melanoma in-situ: Secondary | ICD-10-CM

## 2015-11-22 DIAGNOSIS — D692 Other nonthrombocytopenic purpura: Secondary | ICD-10-CM | POA: Diagnosis not present

## 2015-11-22 DIAGNOSIS — N400 Enlarged prostate without lower urinary tract symptoms: Secondary | ICD-10-CM

## 2015-11-22 DIAGNOSIS — Z125 Encounter for screening for malignant neoplasm of prostate: Secondary | ICD-10-CM | POA: Diagnosis not present

## 2015-11-22 DIAGNOSIS — Z85820 Personal history of malignant melanoma of skin: Secondary | ICD-10-CM | POA: Diagnosis not present

## 2015-11-22 DIAGNOSIS — Z1159 Encounter for screening for other viral diseases: Secondary | ICD-10-CM

## 2015-11-22 LAB — CBC WITH DIFFERENTIAL/PLATELET
BASOS ABS: 93 {cells}/uL (ref 0–200)
Basophils Relative: 1 %
EOS PCT: 2 %
Eosinophils Absolute: 186 cells/uL (ref 15–500)
HCT: 45.9 % (ref 38.5–50.0)
Hemoglobin: 16.3 g/dL (ref 13.2–17.1)
LYMPHS PCT: 16 %
Lymphs Abs: 1488 cells/uL (ref 850–3900)
MCH: 33.1 pg — AB (ref 27.0–33.0)
MCHC: 35.5 g/dL (ref 32.0–36.0)
MCV: 93.3 fL (ref 80.0–100.0)
MONOS PCT: 8 %
MPV: 11 fL (ref 7.5–12.5)
Monocytes Absolute: 744 cells/uL (ref 200–950)
NEUTROS ABS: 6789 {cells}/uL (ref 1500–7800)
NEUTROS PCT: 73 %
PLATELETS: 276 10*3/uL (ref 140–400)
RBC: 4.92 MIL/uL (ref 4.20–5.80)
RDW: 13.2 % (ref 11.0–15.0)
WBC: 9.3 10*3/uL (ref 4.0–10.5)

## 2015-11-22 LAB — COMPREHENSIVE METABOLIC PANEL
ALT: 13 U/L (ref 9–46)
AST: 20 U/L (ref 10–35)
Albumin: 4.4 g/dL (ref 3.6–5.1)
Alkaline Phosphatase: 63 U/L (ref 40–115)
BUN: 7 mg/dL (ref 7–25)
CHLORIDE: 96 mmol/L — AB (ref 98–110)
CO2: 27 mmol/L (ref 20–31)
CREATININE: 0.62 mg/dL — AB (ref 0.70–1.25)
Calcium: 9.3 mg/dL (ref 8.6–10.3)
GLUCOSE: 92 mg/dL (ref 65–99)
Potassium: 4.1 mmol/L (ref 3.5–5.3)
SODIUM: 132 mmol/L — AB (ref 135–146)
Total Bilirubin: 0.6 mg/dL (ref 0.2–1.2)
Total Protein: 6.9 g/dL (ref 6.1–8.1)

## 2015-11-22 LAB — LIPID PANEL
CHOL/HDL RATIO: 3.1 ratio (ref <=5.0)
Cholesterol: 200 mg/dL (ref 125–200)
HDL: 65 mg/dL (ref >=40)
LDL CALC: 117 mg/dL (ref <130)
Triglycerides: 92 mg/dL (ref <150)
VLDL: 18 mg/dL (ref <30)

## 2015-11-22 LAB — POCT URINALYSIS DIPSTICK
Bilirubin, UA: NEGATIVE
Blood, UA: NEGATIVE
GLUCOSE UA: NEGATIVE
KETONES UA: NEGATIVE
LEUKOCYTES UA: NEGATIVE
Nitrite, UA: NEGATIVE
PROTEIN UA: NEGATIVE
Spec Grav, UA: 1.015
Urobilinogen, UA: NEGATIVE
pH, UA: 7.5

## 2015-11-22 MED ORDER — TADALAFIL 5 MG PO TABS: 5.0000 mg | ORAL_TABLET | Freq: Every day | ORAL | 3 refills | Status: DC | PRN

## 2015-11-22 MED ORDER — HYDROCHLOROTHIAZIDE 12.5 MG PO CAPS: ORAL_CAPSULE | ORAL | 3 refills | Status: DC

## 2015-11-22 NOTE — Progress Notes (Signed)
Subjective:    Patient ID: Jerry Douglas, male    DOB: 04-19-1955, 60 y.o.   MRN: [id number]  HPI He is here for complete examination. He presently is using Cialis and getting good results for treatment of his BPH. He has had difficulty with dribbling, decreased stream, urgency and nocturia and all this has been improved greatly with the use of Cialis. He also has a history of melanoma and does get skin checks every 6 months. He would like prostate cancer screening. He still has difficulty with Dupuytren's contracture of the hand but it is minimal. He also has Peyronnies disease but again not having much difficulty. He is retired. He is in a 38 year relationship which is going quite well. Family and social history as well as health maintenance and immunizations were reviewed. He recently did have a cervical spine injury and is still wearing a collar. He is seeing neurosurgery for follow-up on this.   Review of Systems  All other systems reviewed and are negative.      Objective:   Physical Exam BP 130/90   Pulse 81   Ht 5\' 5"  (1.651 m)   Wt 105 lb 9.6 oz (47.9 kg)   BMI 17.57 kg/m   General Appearance:    Alert, cooperative, no distress, appears stated age  Head:    Normocephalic, without obvious abnormality, atraumatic  Eyes:    PERRL, conjunctiva/corneas clear, EOM's intact, fundi    benign  Ears:    Normal TM's and external ear canals  Nose:   Nares normal, mucosa normal, no drainage or sinus   tenderness  Throat:   Lips, mucosa, and tongue normal; teeth and gums normal  Neck:   Supple, no lymphadenopathy;  thyroid:  no   enlargement/tenderness/nodules; no carotid   bruit or JVD  Back:    Spine nontender, no curvature, ROM normal, no CVA     tenderness  Lungs:     Clear to auscultation bilaterally without wheezes, rales or     ronchi; respirations unlabored  Chest Wall:    No tenderness or deformity   Heart:    Regular rate and rhythm, S1 and S2 normal, no murmur, rub   or  gallop  Breast Exam:    No chest wall tenderness, masses or gynecomastia  Abdomen:     Soft, non-tender, nondistended, normoactive bowel sounds,    no masses, no hepatosplenomegaly  Genitalia:  Deferred   Rectal:  Deferred   Extremities:   No clubbing, cyanosis or edema  Pulses:   2+ and symmetric all extremities  Skin:   Skin color, texture, turgor normal except for purpuric lesions on the arms   Lymph nodes:   Cervical, supraclavicular, and axillary nodes normal  Neurologic:   CNII-XII intact, normal strength, sensation and gait; reflexes 2+ and symmetric throughout          Psych:   Normal mood, affect, hygiene and grooming.          Assessment & Plan:  Routine general medical examination at a health care facility - Plan: POCT Urinalysis Dipstick, CBC with Differential/Platelet, Comprehensive metabolic panel, Lipid panel  Essential hypertension - Plan: CBC with Differential/Platelet, Comprehensive metabolic panel, DISCONTINUED: hydrochlorothiazide (MICROZIDE) 12.5 MG capsule  History of melanoma in situ  BPH (benign prostatic hyperplasia) - Plan: tadalafil (CIALIS) 5 MG tablet  Need for hepatitis C screening test - Plan: Hepatitis C antibody  Screening for prostate cancer - Plan: PSA  Dupuytren's contracture of right hand  Senile purpura ([place])  Peyronie's disease He will continue to follow-up with urology concerning the Peyronie's disease. His hands are not giving him much difficulty. He will continue to see dermatology regularly. His medications were renewed. Discussed the senile purpura  with him.

## 2015-11-22 NOTE — Telephone Encounter (Signed)
Resent med to [facility]

## 2015-11-22 NOTE — Telephone Encounter (Signed)
Faxed form for P.A. CIALIS

## 2015-11-22 NOTE — Telephone Encounter (Signed)
Pt called and stated that his hydrochlorothiazide should have gone to [facility] and not [facility]. Please change

## 2015-11-23 LAB — PSA: PSA: 0.3 ng/mL (ref <=4.0)

## 2015-11-23 LAB — HEPATITIS C ANTIBODY: HCV AB: NEGATIVE

## 2015-11-27 NOTE — Telephone Encounter (Signed)
P.A. Approved til 11/22/16

## 2015-11-29 NOTE — Telephone Encounter (Signed)
Left message for pt, faxed pharmacy  

## 2015-12-02 DIAGNOSIS — Z872 Personal history of diseases of the skin and subcutaneous tissue: Secondary | ICD-10-CM | POA: Diagnosis not present

## 2015-12-02 DIAGNOSIS — L570 Actinic keratosis: Secondary | ICD-10-CM | POA: Diagnosis not present

## 2015-12-02 DIAGNOSIS — D225 Melanocytic nevi of trunk: Secondary | ICD-10-CM | POA: Diagnosis not present

## 2015-12-02 DIAGNOSIS — Z85820 Personal history of malignant melanoma of skin: Secondary | ICD-10-CM | POA: Diagnosis not present

## 2015-12-02 DIAGNOSIS — D485 Neoplasm of uncertain behavior of skin: Secondary | ICD-10-CM | POA: Diagnosis not present

## 2015-12-02 DIAGNOSIS — L821 Other seborrheic keratosis: Secondary | ICD-10-CM | POA: Diagnosis not present

## 2015-12-03 ENCOUNTER — Other Ambulatory Visit: Payer: Self-pay | Admitting: Family Medicine

## 2015-12-03 DIAGNOSIS — N400 Enlarged prostate without lower urinary tract symptoms: Secondary | ICD-10-CM

## 2015-12-03 NOTE — Telephone Encounter (Deleted)
Is this okay to refill? 

## 2015-12-03 NOTE — Telephone Encounter (Signed)
This was sent to local pharmacy instead

## 2015-12-06 ENCOUNTER — Other Ambulatory Visit: Payer: Self-pay | Admitting: Neurosurgery

## 2015-12-06 DIAGNOSIS — S129XXD Fracture of neck, unspecified, subsequent encounter: Secondary | ICD-10-CM

## 2015-12-16 ENCOUNTER — Other Ambulatory Visit: Payer: Self-pay | Admitting: Neurosurgery

## 2015-12-16 ENCOUNTER — Ambulatory Visit
Admission: RE | Admit: 2015-12-16 | Discharge: 2015-12-16 | Disposition: A | Discharge status: Home / Self Care | Payer: Federal, State, Local not specified - PPO | Source: Ambulatory Visit | Attending: Neurosurgery | Admitting: Neurosurgery

## 2015-12-16 DIAGNOSIS — S129XXD Fracture of neck, unspecified, subsequent encounter: Secondary | ICD-10-CM

## 2015-12-16 DIAGNOSIS — S12000A Unspecified displaced fracture of first cervical vertebra, initial encounter for closed fracture: Secondary | ICD-10-CM | POA: Diagnosis not present

## 2015-12-17 DIAGNOSIS — D485 Neoplasm of uncertain behavior of skin: Secondary | ICD-10-CM | POA: Diagnosis not present

## 2015-12-17 DIAGNOSIS — L905 Scar conditions and fibrosis of skin: Secondary | ICD-10-CM | POA: Diagnosis not present

## 2015-12-20 DIAGNOSIS — I10 Essential (primary) hypertension: Secondary | ICD-10-CM | POA: Diagnosis not present

## 2015-12-20 DIAGNOSIS — S129XXD Fracture of neck, unspecified, subsequent encounter: Secondary | ICD-10-CM | POA: Diagnosis not present

## 2016-01-05 DIAGNOSIS — K080 Exfoliation of teeth due to systemic causes: Secondary | ICD-10-CM | POA: Diagnosis not present

## 2016-01-11 ENCOUNTER — Encounter: Payer: Self-pay | Admitting: Family Medicine

## 2016-02-11 ENCOUNTER — Other Ambulatory Visit: Payer: Self-pay | Admitting: Family Medicine

## 2016-02-11 DIAGNOSIS — N400 Enlarged prostate without lower urinary tract symptoms: Secondary | ICD-10-CM

## 2016-02-11 NOTE — Telephone Encounter (Signed)
Is this okay to refill? 

## 2016-03-20 DIAGNOSIS — S129XXD Fracture of neck, unspecified, subsequent encounter: Secondary | ICD-10-CM | POA: Diagnosis not present

## 2016-03-29 DIAGNOSIS — K080 Exfoliation of teeth due to systemic causes: Secondary | ICD-10-CM | POA: Diagnosis not present

## 2016-05-01 ENCOUNTER — Other Ambulatory Visit: Payer: Self-pay | Admitting: Family Medicine

## 2016-05-01 DIAGNOSIS — N400 Enlarged prostate without lower urinary tract symptoms: Secondary | ICD-10-CM

## 2016-05-02 NOTE — Telephone Encounter (Signed)
Is this okay to refill? 

## 2016-05-15 DIAGNOSIS — K5730 Diverticulosis of large intestine without perforation or abscess without bleeding: Secondary | ICD-10-CM | POA: Diagnosis not present

## 2016-05-15 DIAGNOSIS — K625 Hemorrhage of anus and rectum: Secondary | ICD-10-CM | POA: Diagnosis not present

## 2016-06-01 ENCOUNTER — Telehealth: Payer: Self-pay

## 2016-06-01 DIAGNOSIS — D1801 Hemangioma of skin and subcutaneous tissue: Secondary | ICD-10-CM | POA: Diagnosis not present

## 2016-06-01 DIAGNOSIS — L814 Other melanin hyperpigmentation: Secondary | ICD-10-CM | POA: Diagnosis not present

## 2016-06-01 DIAGNOSIS — D225 Melanocytic nevi of trunk: Secondary | ICD-10-CM | POA: Diagnosis not present

## 2016-06-01 DIAGNOSIS — L821 Other seborrheic keratosis: Secondary | ICD-10-CM | POA: Diagnosis not present

## 2016-06-01 NOTE — Telephone Encounter (Signed)
Foscoe from [facility] called to let us know that pt had a 1 cm enlarged lymph node and they recommend f/u with pt in 1 month.    They are going to fax notes over for you to review. I have left message for pt to CB and schedule f/u. Victorino December

## 2016-06-06 ENCOUNTER — Institutional Professional Consult (permissible substitution): Payer: Self-pay | Admitting: Family Medicine

## 2016-06-09 DIAGNOSIS — R590 Localized enlarged lymph nodes: Secondary | ICD-10-CM | POA: Diagnosis not present

## 2016-06-19 DIAGNOSIS — R590 Localized enlarged lymph nodes: Secondary | ICD-10-CM | POA: Diagnosis not present

## 2016-06-27 DIAGNOSIS — K080 Exfoliation of teeth due to systemic causes: Secondary | ICD-10-CM | POA: Diagnosis not present

## 2016-07-04 DIAGNOSIS — K080 Exfoliation of teeth due to systemic causes: Secondary | ICD-10-CM | POA: Diagnosis not present

## 2016-07-13 DIAGNOSIS — K080 Exfoliation of teeth due to systemic causes: Secondary | ICD-10-CM | POA: Diagnosis not present

## 2016-08-08 ENCOUNTER — Other Ambulatory Visit: Payer: Self-pay | Admitting: Family Medicine

## 2016-08-08 DIAGNOSIS — N400 Enlarged prostate without lower urinary tract symptoms: Secondary | ICD-10-CM

## 2016-08-08 NOTE — Telephone Encounter (Signed)
Is this okay to refill? 

## 2016-09-27 DIAGNOSIS — K080 Exfoliation of teeth due to systemic causes: Secondary | ICD-10-CM | POA: Diagnosis not present

## 2016-10-27 ENCOUNTER — Encounter: Payer: Self-pay | Admitting: Medical

## 2016-10-27 ENCOUNTER — Ambulatory Visit (INDEPENDENT_AMBULATORY_CARE_PROVIDER_SITE_OTHER): Payer: Federal, State, Local not specified - PPO | Admitting: Medical

## 2016-10-27 VITALS — BP 128/80 | HR 78 | Wt 107.8 lb

## 2016-10-27 DIAGNOSIS — Z86008 Personal history of in-situ neoplasm of other site: Secondary | ICD-10-CM

## 2016-10-27 DIAGNOSIS — B029 Zoster without complications: Secondary | ICD-10-CM

## 2016-10-27 DIAGNOSIS — Z86006 Personal history of melanoma in-situ: Secondary | ICD-10-CM

## 2016-10-27 LAB — CBC WITH DIFFERENTIAL/PLATELET
Basophils Absolute: 69 cells/uL (ref 0–200)
Basophils Relative: 1 %
EOS PCT: 4 %
Eosinophils Absolute: 276 cells/uL (ref 15–500)
HCT: 43.3 % (ref 38.5–50.0)
HEMOGLOBIN: 14.9 g/dL (ref 13.2–17.1)
LYMPHS ABS: 1518 {cells}/uL (ref 850–3900)
Lymphocytes Relative: 22 %
MCH: 32 pg (ref 27.0–33.0)
MCHC: 34.4 g/dL (ref 32.0–36.0)
MCV: 92.9 fL (ref 80.0–100.0)
MONOS PCT: 9 %
MPV: 10.4 fL (ref 7.5–12.5)
Monocytes Absolute: 621 cells/uL (ref 200–950)
NEUTROS PCT: 64 %
Neutro Abs: 4416 cells/uL (ref 1500–7800)
PLATELETS: 236 10*3/uL (ref 140–400)
RBC: 4.66 MIL/uL (ref 4.20–5.80)
RDW: 14.2 % (ref 11.0–15.0)
WBC: 6.9 10*3/uL (ref 4.0–10.5)

## 2016-10-27 MED ORDER — VALACYCLOVIR HCL 1 G PO TABS: 1000.0000 mg | ORAL_TABLET | Freq: Three times a day (TID) | ORAL | 0 refills | Status: DC

## 2016-10-27 NOTE — Patient Instructions (Signed)
Shingles Shingles, which is also known as herpes zoster, is an infection that causes a painful skin rash and fluid-filled blisters. Shingles is not related to genital herpes, which is a sexually transmitted infection. Shingles only develops in people who:  Have had chickenpox.  Have received the chickenpox vaccine. (This is rare.)  What are the causes? Shingles is caused by varicella-zoster virus (VZV). This is the same virus that causes chickenpox. After exposure to VZV, the virus stays in the body in an inactive (dormant) state. Shingles develops if the virus reactivates. This can happen many years after the initial exposure to VZV. It is not known what causes this virus to reactivate. What increases the risk? People who have had chickenpox or received the chickenpox vaccine are at risk for shingles. Infection is more common in people who:  Are older than age 50.  Have a weakened defense (immune) system, such as those with HIV, AIDS, or cancer.  Are taking medicines that weaken the immune system, such as transplant medicines.  Are under great stress.  What are the signs or symptoms? Early symptoms of this condition include itching, tingling, and pain in an area on your skin. Pain may be described as burning, stabbing, or throbbing. A few days or weeks after symptoms start, a painful red rash appears, usually on one side of the body in a bandlike or beltlike pattern. The rash eventually turns into fluid-filled blisters that break open, scab over, and dry up in about 2-3 weeks. At any time during the infection, you may also develop:  A fever.  Chills.  A headache.  An upset stomach.  How is this diagnosed? This condition is diagnosed with a skin exam. Sometimes, skin or fluid samples are taken from the blisters before a diagnosis is made. These samples are examined under a microscope or sent to a lab for testing. How is this treated? There is no specific cure for this condition.  Your health care provider will probably prescribe medicines to help you manage pain, recover more quickly, and avoid long-term problems. Medicines may include:  Antiviral drugs.  Anti-inflammatory drugs.  Pain medicines.  If the area involved is on your face, you may be referred to a specialist, such as an eye doctor (ophthalmologist) or an ear, nose, and throat (ENT) doctor to help you avoid eye problems, chronic pain, or disability. Follow these instructions at home: Medicines  Take medicines only as directed by your health care provider.  Apply an anti-itch or numbing cream to the affected area as directed by your health care provider. Blister and Rash Care  Take a cool bath or apply cool compresses to the area of the rash or blisters as directed by your health care provider. This may help with pain and itching.  Keep your rash covered with a loose bandage (dressing). Wear loose-fitting clothing to help ease the pain of material rubbing against the rash.  Keep your rash and blisters clean with mild soap and cool water or as directed by your health care provider.  Check your rash every day for signs of infection. These include redness, swelling, and pain that lasts or increases.  Do not pick your blisters.  Do not scratch your rash. General instructions  Rest as directed by your health care provider.  Keep all follow-up visits as directed by your health care provider. This is important.  Until your blisters scab over, your infection can cause chickenpox in people who have never had it or been vaccinated   against it. To prevent this from happening, avoid contact with other people, especially: ? Babies. ? Pregnant women. ? Children who have eczema. ? Elderly people who have transplants. ? People who have chronic illnesses, such as leukemia or AIDS. Contact a health care provider if:  Your pain is not relieved with prescribed medicines.  Your pain does not get better after  the rash heals.  Your rash looks infected. Signs of infection include redness, swelling, and pain that lasts or increases. Get help right away if:  The rash is on your face or nose.  You have facial pain, pain around your eye area, or loss of feeling on one side of your face.  You have ear pain or you have ringing in your ear.  You have loss of taste.  Your condition gets worse. This information is not intended to replace advice given to you by your health care provider. Make sure you discuss any questions you have with your health care provider. Document Released: 02/13/2005 Document Revised: 10/10/2015 Document Reviewed: 12/25/2013 Elsevier Interactive Patient Education  2017 Else[employer] Inc.     Vasculitis Vasculitis is swelling (inflammation) of the blood vessels. With vasculitis, the blood vessels can become thick, narrow, scarred, or weak, and enough blood may not be able to flow through them. This can cause damage to the muscles, kidneys, lungs, brain, and other parts of the body. There are many types of vasculitis. Some last only a short time while others last a long time. What are the causes? The exact cause is unknown, but vasculitis can develop when the body's immune system attacks its own blood vessels. This attack can be caused by:  An infection.  An immune system disease, such as lupus, rheumatoid arthritis, or scleroderma.  An allergic reaction to a medicine.  Cancer that affects blood cells, such as leukemia and lymphoma.  What increases the risk?  Being a smoker.  Being under stress.  Having a physical injury. What are the signs or symptoms? Symptoms vary depending on the type of vasculitis you have. Symptoms that are common to all types of vasculitis include:  Fever.  Poor appetite.  Weight loss.  Feeling very tired.  Having aches and pains.  Weakness.  Numbness in an area of your body.  Symptoms for specific types of vasculitis  include:  Skin problems, such as sores, spots, or rashes.  Trouble seeing.  Trouble breathing.  Blood in your urine.  Headaches.  Stomach pain.  Stuffy or bloody nose.  How is this diagnosed? Your health care provider will ask about your symptoms and do a physical exam. You may have tests done, such as:  A complete blood count (CBC).  Erythrocyte sedimentation, also called sed rate test.  C-reactive protein (CRP).  Antineutrophil cytoplasmic antibodies (ANCA).  A urine test.  A biopsy of a blood vessel.  A nerve conduction study.  Imaging tests, such as: ? X-rays. ? A CT scan. ? An ultrasound. ? An MRI. ? Angiography.  How is this treated? Treatment will depend on the type of vasculitis you have and how severe it is. Sometimes treatment is not needed. Treatment often includes:  Medicines.  Physical therapy or occupational therapy. This helps strengthen muscles that were weakened by the disease.  You will need to see your health care provider while you are being treated. During follow-up visits, your health care provider will:  Perform blood tests and bone density tests.  Check your blood pressure and blood sugar.  Check  for side effects of any medicines you are taking.  Vasculitis cannot always be cured. Sometimes symptoms go away but the disease does not (the disease goes in remission). If symptoms return, increased treatment may be needed. Follow these instructions at home:  Take medicines only as directed by your health care provider.  Keep all follow-up visits as directed by your health care provider. This is important.  Exercise. Talk with your health care provider about what exercises are okay for you to do. Usually exercises that increase your heart rate (aerobic exercise), such as walking, are recommended. Aerobic exercise helps control your blood pressure and prevent bone loss.  Follow a healthy diet. Include healthy sources of protein, fruits,  vegetables, and whole grains in your diet.  Learn as much as you can about vasculitis, and consider joining a support group. Understanding your condition and talking with others who have it may help you cope. Talk with your health care provider if you feel stressed, anxious, or depressed. Contact a health care provider if:  Your symptoms return, or you have new symptoms.  Your fever, fatigue, headache, or weight loss gets worse.  You have signs of infection, such as fever, warmth, tenderness, redness, or swelling. Get help right away if:  Your vision gets worse.  Your pain does not go away, even after you take pain medicine.  You have chest or stomach pain.  You have trouble breathing.  One side of your face or body suddenly becomes weak or numb.  Your nose bleeds.  There is blood in your urine. This information is not intended to replace advice given to you by your health care provider. Make sure you discuss any questions you have with your health care provider. Document Released: 12/10/2008 Document Revised: 07/22/2015 Document Reviewed: 04/09/2013 Elsevier Interactive Patient Education  [employer].

## 2016-10-27 NOTE — Progress Notes (Signed)
Subjective:  Jerry Douglas is a 61 y.o. male who presents with possible shingles.  Had felt irritations and discomfort in skin of right upper chest and neck starting yesterday, saw what may be small bumps forming.   After looking this up online, wonders about shingles.   Slight warmth and irritated skin feeling right upper chest.  No hx/o shingles.  He had Zostavax 2-3 years ago.   No other recent exposure, no insect bites, no fever, no body aches, no chills.  No NVD.    No other aggravating or relieving factors.  No other c/o.  The following portions of the patient's history were reviewed and updated as appropriate: allergies, current medications, past family history, past medical history, past social history, past surgical history and problem list.  ROS Otherwise as in subjective above  Past Medical History:  Diagnosis Date  . BPH (benign prostatic hyperplasia)   . Diverticulosis   . Hemorrhoids   . Hypertension   . Melanoma in situ ([place]) 2016   Mid upper back  . Wears glasses    Current Outpatient Prescriptions on File Prior to Visit  Medication Sig Dispense Refill  . CIALIS 5 MG tablet TAKE 1 TABLET DAILY AS     NEEDED FOR ERECTILE        DYSFUNCTION 90 tablet 0  . hydrochlorothiazide (MICROZIDE) 12.5 MG capsule TAKE 1 CAPSULE DAILY 90 capsule 3  . Multiple Vitamins-Minerals (MULTIVITAMIN WITH MINERALS) tablet Take 1 tablet by mouth daily.      . polyvinyl alcohol (LIQUIFILM TEARS) 1.4 % ophthalmic solution Place 1 drop into both eyes 2 (two) times daily as needed for dry eyes.    . vitamin E 1000 UNIT capsule Take 1,000 Units by mouth daily.       No current facility-administered medications on file prior to visit.     Objective:Exam BP 128/80   Pulse 78   Wt 107 lb 12.8 oz (48.9 kg)   SpO2 96%   BMI 17.94 kg/m   General appearance: alert, no distress, WD/WN, seated calmly on exam table Neck: supple, no lymphadenopathy, no thyromegaly, no masses Right upper lateral  chest with few patches of erythema with few small 57mm diameter papular lesions, but no obvious vesicles, there is general pink/red coloration in area of right upper chest in general.  No well defined rash.  otherwise scattered macules of torso. No other obvious lymphadenopathy No edema No muscle deformity obvious    Assessment: Encounter Diagnoses  Name Primary?  . Herpes zoster without complication Yes  . History of melanoma in situ      Plan: Discussed the symptoms, exam findings.  Possible early shingles outbreak vs vasculitis vs dermatitis or other.  Rash not well defined.   We will assume shingles based on the skin findings, thus, discussed transmission, precautions to prevent spread to others, particularly high risk groups as discussed including young children, elderly, immunocompromised people, or pregnant women.  Discussed treatment including relative rest, hydration, can use OTC Cortaid topically but don't let others use the cream and discard the cream after this episode of shingles.   Begin Valtrex as below, can use NSAID OTC.  Discussed the possibility of post herpetic neuralgia.   answered their questions, After visit summary given.   If new or different symptoms in the coming days then call after hours line or get re-evaluated.   Follow up: pending labs  Rykar was seen today for rash.  Diagnoses and all orders for this visit:  Herpes zoster without complication -     CBC with Differential/Platelet -     Sedimentation rate  History of melanoma in situ -     CBC with Differential/Platelet -     Sedimentation rate  Other orders -     valACYclovir (VALTREX) 1000 MG tablet; Take 1 tablet (1,000 mg total) by mouth 3 (three) times daily.

## 2016-10-28 LAB — SEDIMENTATION RATE: SED RATE: 1 mm/h (ref 0–20)

## 2016-11-09 ENCOUNTER — Encounter: Payer: Self-pay | Admitting: Family Medicine

## 2016-11-09 ENCOUNTER — Ambulatory Visit (INDEPENDENT_AMBULATORY_CARE_PROVIDER_SITE_OTHER): Payer: Federal, State, Local not specified - PPO | Admitting: Family Medicine

## 2016-11-09 VITALS — BP 122/74 | HR 80 | Ht 65.0 in | Wt 108.2 lb

## 2016-11-09 DIAGNOSIS — B029 Zoster without complications: Secondary | ICD-10-CM

## 2016-11-09 NOTE — Progress Notes (Signed)
   Subjective:    Patient ID: Jerry Douglas, male    DOB: [birth year], 61 y.o.   MRN: [id number]  HPI He is here for recheck. He was placed on Valtrex and noted by the next day that the pain symptoms that were in the classic distribution had disappeared. Presently he is having no pain and only minimal rash.   Review of Systems     Objective:   Physical Exam Alert and in no distress. Slight erythema is noted in a 3 cm area underneath the right clavicle. No other lesions are noted.       Assessment & Plan:  Herpes zoster without complication Is here mainly to verify the diagnosis which I think he definitely did have shingles. This point no further intervention necessary.

## 2016-12-04 ENCOUNTER — Ambulatory Visit (INDEPENDENT_AMBULATORY_CARE_PROVIDER_SITE_OTHER): Payer: Federal, State, Local not specified - PPO | Admitting: Family Medicine

## 2016-12-04 ENCOUNTER — Encounter: Payer: Self-pay | Admitting: Family Medicine

## 2016-12-04 VITALS — BP 150/86 | HR 75 | Resp 16 | Ht 64.5 in | Wt 107.0 lb

## 2016-12-04 DIAGNOSIS — N400 Enlarged prostate without lower urinary tract symptoms: Secondary | ICD-10-CM

## 2016-12-04 DIAGNOSIS — Z86006 Personal history of melanoma in-situ: Secondary | ICD-10-CM

## 2016-12-04 DIAGNOSIS — M720 Palmar fascial fibromatosis [Dupuytren]: Secondary | ICD-10-CM | POA: Diagnosis not present

## 2016-12-04 DIAGNOSIS — R3912 Poor urinary stream: Secondary | ICD-10-CM | POA: Diagnosis not present

## 2016-12-04 DIAGNOSIS — Z0000 Encounter for general adult medical examination without abnormal findings: Secondary | ICD-10-CM | POA: Diagnosis not present

## 2016-12-04 DIAGNOSIS — I10 Essential (primary) hypertension: Secondary | ICD-10-CM | POA: Diagnosis not present

## 2016-12-04 DIAGNOSIS — Z23 Encounter for immunization: Secondary | ICD-10-CM

## 2016-12-04 DIAGNOSIS — N401 Enlarged prostate with lower urinary tract symptoms: Secondary | ICD-10-CM | POA: Diagnosis not present

## 2016-12-04 DIAGNOSIS — Z86008 Personal history of in-situ neoplasm of other site: Secondary | ICD-10-CM

## 2016-12-04 DIAGNOSIS — N486 Induration penis plastica: Secondary | ICD-10-CM | POA: Diagnosis not present

## 2016-12-04 DIAGNOSIS — Z125 Encounter for screening for malignant neoplasm of prostate: Secondary | ICD-10-CM

## 2016-12-04 LAB — POCT URINALYSIS DIP (MANUAL ENTRY)
BILIRUBIN UA: NEGATIVE
GLUCOSE UA: NEGATIVE mg/dL
Ketones, POC UA: NEGATIVE mg/dL
LEUKOCYTES UA: NEGATIVE
NITRITE UA: NEGATIVE
PH UA: 7.5 (ref 5.0–8.0)
Protein Ur, POC: NEGATIVE mg/dL
Spec Grav, UA: 1.015 (ref 1.010–1.025)
Urobilinogen, UA: 4 E.U./dL — AB

## 2016-12-04 LAB — CBC WITH DIFFERENTIAL/PLATELET
BASOS PCT: 0.7 %
Basophils Absolute: 47 cells/uL (ref 0–200)
EOS ABS: 121 {cells}/uL (ref 15–500)
Eosinophils Relative: 1.8 %
HEMATOCRIT: 44 % (ref 38.5–50.0)
HEMOGLOBIN: 15.5 g/dL (ref 13.2–17.1)
LYMPHS ABS: 1407 {cells}/uL (ref 850–3900)
MCH: 32.4 pg (ref 27.0–33.0)
MCHC: 35.2 g/dL (ref 32.0–36.0)
MCV: 91.9 fL (ref 80.0–100.0)
MONOS PCT: 8.5 %
MPV: 11.3 fL (ref 7.5–12.5)
NEUTROS ABS: 4556 {cells}/uL (ref 1500–7800)
Neutrophils Relative %: 68 %
Platelets: 257 10*3/uL (ref 140–400)
RBC: 4.79 10*6/uL (ref 4.20–5.80)
RDW: 13.9 % (ref 11.0–15.0)
Total Lymphocyte: 21 %
WBC mixed population: 570 cells/uL (ref 200–950)
WBC: 6.7 10*3/uL (ref 3.8–10.8)

## 2016-12-04 LAB — LIPID PANEL
CHOL/HDL RATIO: 3.2 (calc) (ref <5.0)
Cholesterol: 208 mg/dL — ABNORMAL HIGH (ref <200)
HDL: 66 mg/dL (ref >40)
LDL CHOLESTEROL (CALC): 122 mg/dL — AB
NON-HDL CHOLESTEROL (CALC): 142 mg/dL — AB (ref <130)
TRIGLYCERIDES: 94 mg/dL (ref <150)

## 2016-12-04 LAB — COMPREHENSIVE METABOLIC PANEL
AG RATIO: 1.5 (calc) (ref 1.0–2.5)
ALT: 9 U/L (ref 9–46)
AST: 15 U/L (ref 10–35)
Albumin: 4.1 g/dL (ref 3.6–5.1)
Alkaline phosphatase (APISO): 57 U/L (ref 40–115)
BUN / CREAT RATIO: 13 (calc) (ref 6–22)
BUN: 8 mg/dL (ref 7–25)
CHLORIDE: 93 mmol/L — AB (ref 98–110)
CO2: 27 mmol/L (ref 20–32)
Calcium: 9.1 mg/dL (ref 8.6–10.3)
Creat: 0.61 mg/dL — ABNORMAL LOW (ref 0.70–1.25)
GLOBULIN: 2.7 g/dL (ref 1.9–3.7)
GLUCOSE: 91 mg/dL (ref 65–99)
Potassium: 4 mmol/L (ref 3.5–5.3)
SODIUM: 129 mmol/L — AB (ref 135–146)
TOTAL PROTEIN: 6.8 g/dL (ref 6.1–8.1)
Total Bilirubin: 0.5 mg/dL (ref 0.2–1.2)

## 2016-12-04 LAB — PSA: PSA: 0.4 ng/mL (ref <=4.0)

## 2016-12-04 MED ORDER — TADALAFIL 5 MG PO TABS: ORAL_TABLET | ORAL | 3 refills | Status: DC

## 2016-12-04 MED ORDER — HYDROCHLOROTHIAZIDE 12.5 MG PO CAPS: ORAL_CAPSULE | ORAL | 3 refills | Status: DC

## 2016-12-04 NOTE — Progress Notes (Signed)
Subjective:    Patient ID: Jerry Douglas, male    DOB: 03-18-1955, 61 y.o.   MRN: [id number]  HPI He is here for complete examination. He does have underlying BPH with decreased stream, occasional incontinence and nocturia. He does find Cialis is quite helpful for this. He continues on Microzide for treatment of hypertension. He also is followed by dermatology for melanoma in situ and is scheduled for a follow-up evaluation in the next week or 2. He is on a six-month schedule. He does have Peyronie's disease and is using vitamin E on this. He is also using vitamin E on Dupuytren's contracture of the right hand. He does note some improvement. He is now retired. He has been in a long-term monogamous relationship. Family and social history as well as health maintenance and immunizations was reviewed. He will need a colonoscopy but is willing to wait till next year to see if his insurance will cover the new Cologuard testing.   Review of Systems  All other systems reviewed and are negative.      Objective:   Physical Exam BP (!) 150/86 (BP Location: Right Arm, Patient Position: Sitting, Cuff Size: Normal)   Pulse 75   Resp 16   Ht 5' 4.5" (1.638 m)   Wt 107 lb (48.5 kg)   SpO2 98%   BMI 18.08 kg/m   General Appearance:    Alert, cooperative, no distress, appears stated age  Head:    Normocephalic, without obvious abnormality, atraumatic  Eyes:    PERRL, conjunctiva/corneas clear, EOM's intact, fundi    benign  Ears:    Normal TM's and external ear canals  Nose:   Nares normal, mucosa normal, no drainage or sinus   tenderness  Throat:   Lips, mucosa, and tongue normal; teeth and gums normal  Neck:   Supple, no lymphadenopathy;  thyroid:  no   enlargement/tenderness/nodules; no carotid   bruit or JVD     Lungs:     Clear to auscultation bilaterally without wheezes, rales or     ronchi; respirations unlabored      Heart:    Regular rate and rhythm, S1 and S2 normal, no murmur, rub  or gallop     Abdomen:     Soft, non-tender, nondistended, normoactive bowel sounds,    no masses, no hepatosplenomegaly  Genitalia:    Normal male external genitalia without lesions.  Testicles without masses.  No inguinal hernias.  Rectal:   Deferred   Extremities:   No clubbing, cyanosis or edema  Pulses:   2+ and symmetric all extremities  Skin:   Skin color, texture, turgor normal, no rashes or lesions  Lymph nodes:   Cervical, supraclavicular, and axillary nodes normal  Neurologic:   CNII-XII intact, normal strength, sensation and gait; reflexes 2+ and symmetric throughout          Psych:   Normal mood, affect, hygiene and grooming.           Assessment & Plan:  Routine general medical examination at a health care facility - Plan: POCT urinalysis dipstick, CBC with Differential/Platelet, Comprehensive metabolic panel, Lipid panel  Essential hypertension - Plan: CBC with Differential/Platelet, Comprehensive metabolic panel, hydrochlorothiazide (MICROZIDE) 12.5 MG capsule  History of melanoma in situ  Benign prostatic hyperplasia, unspecified whether lower urinary tract symptoms present  Dupuytren's contracture of right hand  Peyronie's disease  Need for shingles vaccine - Plan: Varicella-zoster vaccine IM (Shingrix)  Need for Tdap vaccination - Plan: Tdap vaccine  greater than or equal to 7yo IM  Screening for prostate cancer - Plan: PSA  Benign prostatic hyperplasia with weak urinary stream - Plan: tadalafil (CIALIS) 5 MG tablet His blood pressure is up slightly. I will have him come back in a month and bring his blood pressure machine with him. His immunizations were updated. He will continue on vitamin E. Continue to be followed by dermatology.

## 2016-12-05 DIAGNOSIS — L814 Other melanin hyperpigmentation: Secondary | ICD-10-CM | POA: Diagnosis not present

## 2016-12-05 DIAGNOSIS — D225 Melanocytic nevi of trunk: Secondary | ICD-10-CM | POA: Diagnosis not present

## 2016-12-05 DIAGNOSIS — L821 Other seborrheic keratosis: Secondary | ICD-10-CM | POA: Diagnosis not present

## 2016-12-05 DIAGNOSIS — D1801 Hemangioma of skin and subcutaneous tissue: Secondary | ICD-10-CM | POA: Diagnosis not present

## 2016-12-12 DIAGNOSIS — K080 Exfoliation of teeth due to systemic causes: Secondary | ICD-10-CM | POA: Diagnosis not present

## 2017-01-08 ENCOUNTER — Telehealth: Payer: Self-pay | Admitting: Internal Medicine

## 2017-01-08 ENCOUNTER — Other Ambulatory Visit: Payer: Federal, State, Local not specified - PPO

## 2017-01-08 MED ORDER — LISINOPRIL-HYDROCHLOROTHIAZIDE 10-12.5 MG PO TABS: 1.0000 | ORAL_TABLET | Freq: Every day | ORAL | 3 refills | Status: DC

## 2017-01-08 NOTE — Telephone Encounter (Signed)
Tell him to stop the HCTZ and I sent in the prescription for lisinopril.

## 2017-01-08 NOTE — Telephone Encounter (Signed)
My chart msg sent to pt. Jerry Douglas

## 2017-01-08 NOTE — Telephone Encounter (Signed)
Pt came by for a bp check. Pt's bp cuff read 170/89 and our office cuff read 160/90. Pt states this morning before taking his bp med it was 144/87. He checks his bp about every other day and has been getting high readings. Please advise what to do. You can send him a [employer]. If meds needs to be changed then send to [facility] # [id number]

## 2017-02-05 ENCOUNTER — Other Ambulatory Visit: Payer: Federal, State, Local not specified - PPO

## 2017-02-08 ENCOUNTER — Other Ambulatory Visit (INDEPENDENT_AMBULATORY_CARE_PROVIDER_SITE_OTHER): Payer: Federal, State, Local not specified - PPO

## 2017-02-08 DIAGNOSIS — Z23 Encounter for immunization: Secondary | ICD-10-CM

## 2017-04-04 DIAGNOSIS — K080 Exfoliation of teeth due to systemic causes: Secondary | ICD-10-CM | POA: Diagnosis not present

## 2017-04-13 IMAGING — CR DG CERVICAL SPINE 2 OR 3 VIEWS
3 series · 3 of 3 positions shown · non-contrast
Comparison: CT C-spine of 12/16/2015

CLINICAL DATA: History of C1 burst fracture, followup

EXAM:
CERVICAL SPINE - 2-3 VIEW

[w c-spine lat (1 of 3)]
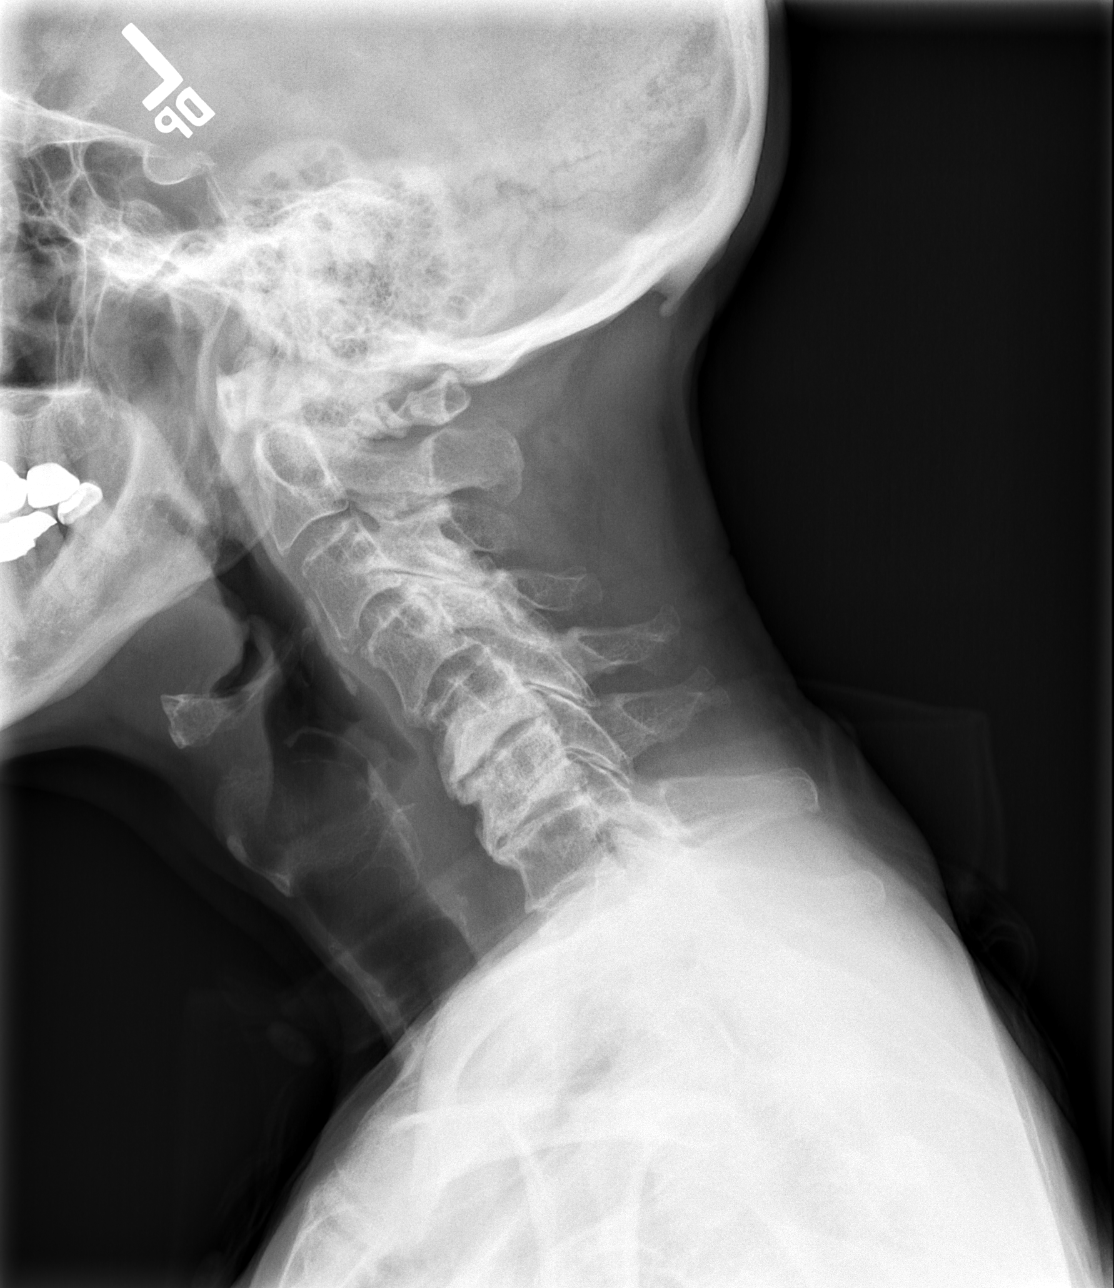

[w c-spine lat (2 of 3)]
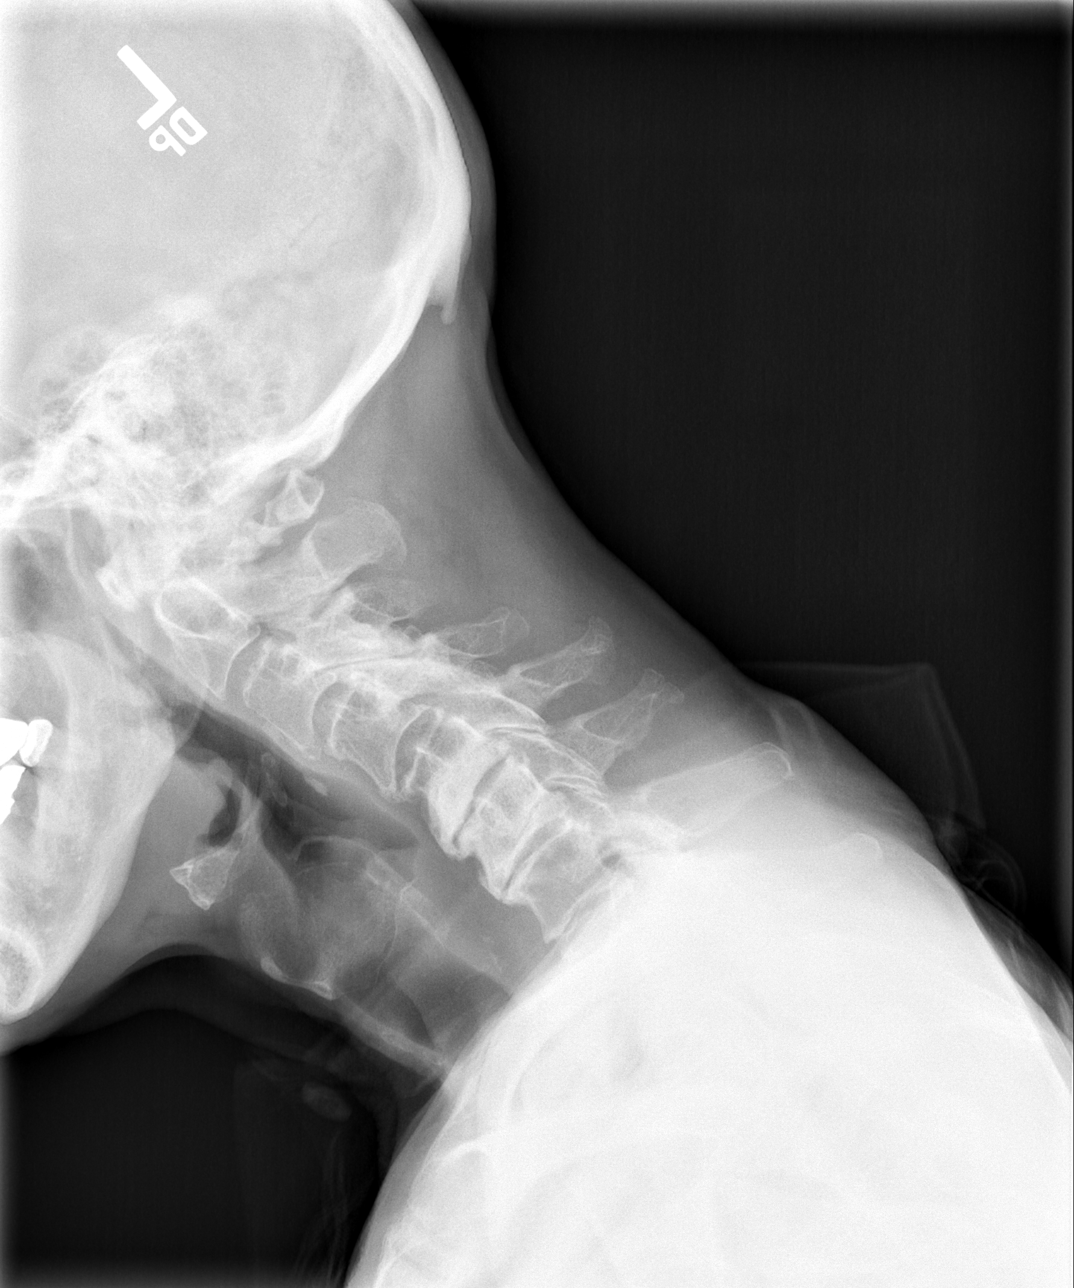

[w c-spine lat (3 of 3)]
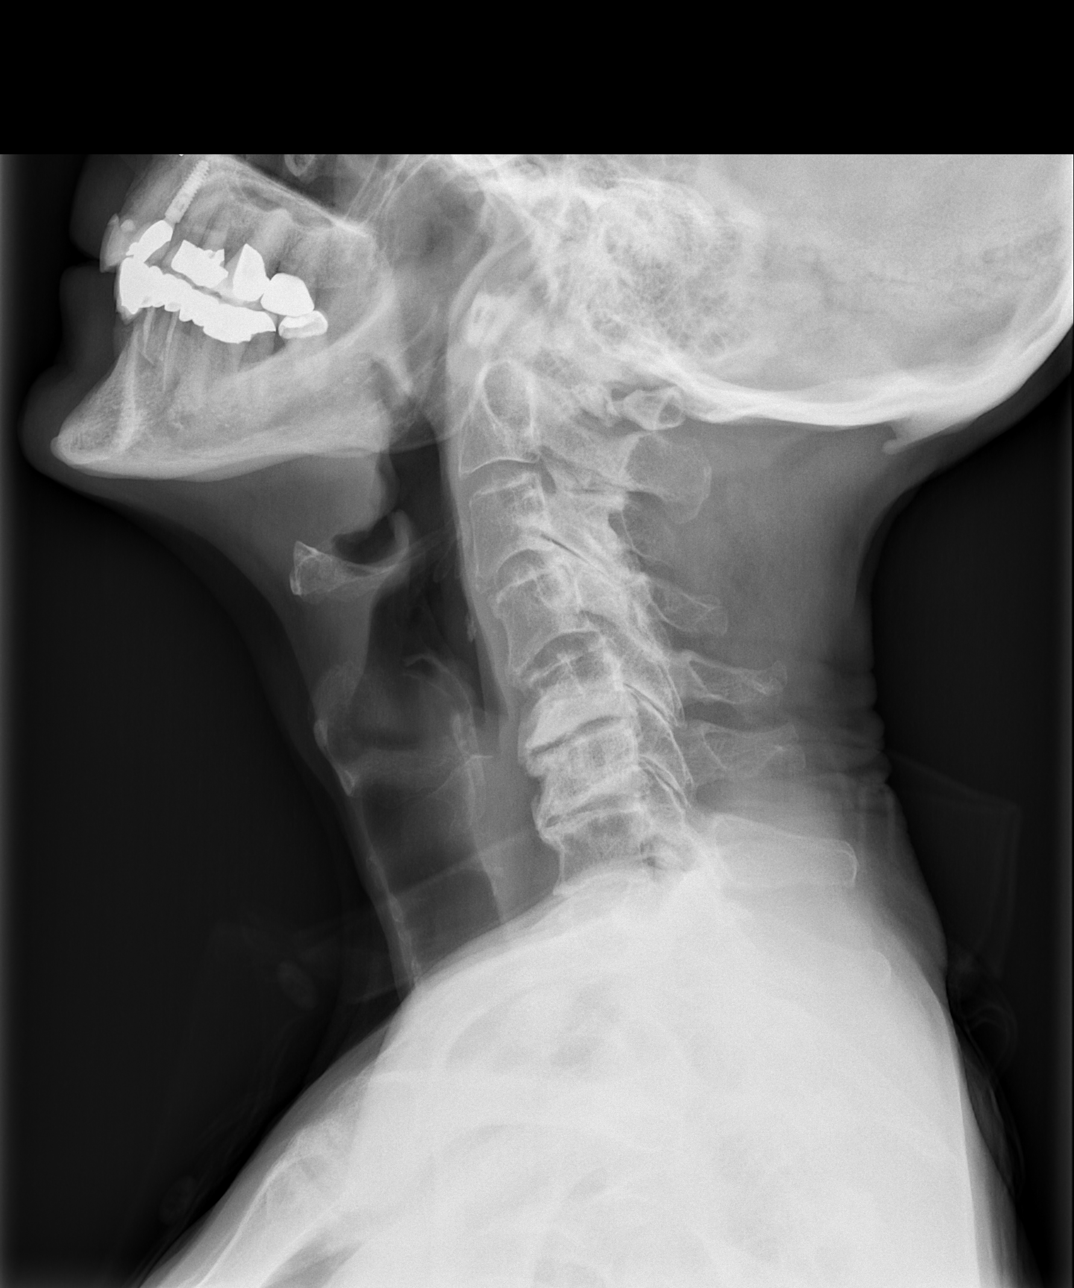

[3 of 3 positions shown; findings below may reference images not displayed]

FINDINGS: There is slight reversal of the cervical spine curvature. The C1
burst fracture is not as well visualized by plain film, but there is
irregularity of the posterior elements seen on the lateral view of
C1. The atlantoaxial distance on the neutral lateral view measures
2.9 mm. On flexion this atlantoaxial distance measures 2.8 mm, and
2.6 mm on extension. Therefore, there is no evidence of atlantoaxial
instability. There is degenerative disc disease at C5-6 and C6-7
where there is loss of disc space and sclerosis with spurring. No
prevertebral soft tissue swelling is seen.
IMPRESSION: 1. Stable atlantoaxial distance through flexion and extension
measuring no more than 2.9 mm.
2. Slight reversal of cervical spine curvature in the neutral
position.
3. Degenerative disc disease at C5-6 and C6-7.

I discussed the findings of this study with Dr. Gargi at the time
of interpretation.

## 2017-06-11 DIAGNOSIS — D225 Melanocytic nevi of trunk: Secondary | ICD-10-CM | POA: Diagnosis not present

## 2017-06-11 DIAGNOSIS — L821 Other seborrheic keratosis: Secondary | ICD-10-CM | POA: Diagnosis not present

## 2017-06-11 DIAGNOSIS — D1801 Hemangioma of skin and subcutaneous tissue: Secondary | ICD-10-CM | POA: Diagnosis not present

## 2017-06-11 DIAGNOSIS — D485 Neoplasm of uncertain behavior of skin: Secondary | ICD-10-CM | POA: Diagnosis not present

## 2017-06-11 DIAGNOSIS — L814 Other melanin hyperpigmentation: Secondary | ICD-10-CM | POA: Diagnosis not present

## 2017-07-02 DIAGNOSIS — L570 Actinic keratosis: Secondary | ICD-10-CM | POA: Diagnosis not present

## 2017-07-09 ENCOUNTER — Encounter: Payer: Self-pay | Admitting: Family Medicine

## 2017-07-10 DIAGNOSIS — K080 Exfoliation of teeth due to systemic causes: Secondary | ICD-10-CM | POA: Diagnosis not present

## 2017-10-31 DIAGNOSIS — K080 Exfoliation of teeth due to systemic causes: Secondary | ICD-10-CM | POA: Diagnosis not present

## 2017-12-05 ENCOUNTER — Encounter: Payer: Federal, State, Local not specified - PPO | Admitting: Family Medicine

## 2017-12-17 DIAGNOSIS — K080 Exfoliation of teeth due to systemic causes: Secondary | ICD-10-CM | POA: Diagnosis not present

## 2017-12-24 ENCOUNTER — Ambulatory Visit: Payer: Federal, State, Local not specified - PPO | Admitting: Family Medicine

## 2017-12-24 ENCOUNTER — Encounter: Payer: Self-pay | Admitting: Family Medicine

## 2017-12-24 VITALS — BP 136/84 | HR 74 | Temp 98.4°F | Ht 64.5 in | Wt 106.4 lb

## 2017-12-24 DIAGNOSIS — Z0000 Encounter for general adult medical examination without abnormal findings: Secondary | ICD-10-CM | POA: Diagnosis not present

## 2017-12-24 DIAGNOSIS — I10 Essential (primary) hypertension: Secondary | ICD-10-CM | POA: Diagnosis not present

## 2017-12-24 DIAGNOSIS — N401 Enlarged prostate with lower urinary tract symptoms: Secondary | ICD-10-CM

## 2017-12-24 DIAGNOSIS — Z86006 Personal history of melanoma in-situ: Secondary | ICD-10-CM | POA: Diagnosis not present

## 2017-12-24 DIAGNOSIS — R3912 Poor urinary stream: Secondary | ICD-10-CM

## 2017-12-24 DIAGNOSIS — Z125 Encounter for screening for malignant neoplasm of prostate: Secondary | ICD-10-CM

## 2017-12-24 MED ORDER — TADALAFIL 5 MG PO TABS: ORAL_TABLET | ORAL | 3 refills | Status: DC

## 2017-12-24 MED ORDER — LISINOPRIL-HYDROCHLOROTHIAZIDE 10-12.5 MG PO TABS: 1.0000 | ORAL_TABLET | Freq: Every day | ORAL | 3 refills | Status: DC

## 2017-12-24 NOTE — Progress Notes (Signed)
   Subjective:    Patient ID: Jerry Douglas, male    DOB: 03-30-1955, 62 y.o.   MRN: [id number]  HPI He is here for complete examination.  He continues to do quite nicely on his blood pressure medication.  His BPH is better controlled on Cialis and he wants to continue on that.  Follows up regularly with dermatology for his previous history of melanoma in situ.  He is retired and very much enjoying that.  He has no other concerns or complaints.  Family and social history as well as health maintenance and immunizations was reviewed   Review of Systems  All other systems reviewed and are negative.      Objective:   Physical Exam BP 136/84 (BP Location: Left Arm, Patient Position: Sitting)   Pulse 74   Temp 98.4 F (36.9 C)   Ht 5' 4.5" (1.638 m)   Wt 106 lb 6.4 oz (48.3 kg)   SpO2 98%   BMI 17.98 kg/m   General Appearance:    Alert, cooperative, no distress, appears stated age  Head:    Normocephalic, without obvious abnormality, atraumatic  Eyes:    PERRL, conjunctiva/corneas clear, EOM's intact, fundi    benign  Ears:    Normal TM's and external ear canals  Nose:   Nares normal, mucosa normal, no drainage or sinus   tenderness  Throat:   Lips, mucosa, and tongue normal; teeth and gums normal  Neck:   Supple, no lymphadenopathy;  thyroid:  no   enlargement/tenderness/nodules; no carotid   bruit or JVD     Lungs:     Clear to auscultation bilaterally without wheezes, rales or     ronchi; respirations unlabored      Heart:    Regular rate and rhythm, S1 and S2 normal, no murmur, rub   or gallop     Abdomen:     Soft, non-tender, nondistended, normoactive bowel sounds,    no masses, no hepatosplenomegaly  Genitalia:   Deferred  Rectal:   Deferred  Extremities:   No clubbing, cyanosis or edema  Pulses:   2+ and symmetric all extremities  Skin:   Skin color, texture, turgor normal, no rashes or lesions  Lymph nodes:   Cervical, supraclavicular, and axillary nodes normal    Neurologic:   CNII-XII intact, normal strength, sensation and gait; reflexes 2+ and symmetric throughout          Psych:   Normal mood, affect, hygiene and grooming.           Assessment & Plan:  Routine general medical examination at a health care facility - Plan: CBC with Differential/Platelet, Comprehensive metabolic panel, Lipid panel  Essential hypertension - Plan: CBC with Differential/Platelet, Comprehensive metabolic panel, lisinopril-hydrochlorothiazide (PRINZIDE,ZESTORETIC) 10-12.5 MG tablet  Benign prostatic hyperplasia with weak urinary stream - Plan: tadalafil (CIALIS) 5 MG tablet  Screening for prostate cancer - Plan: PSA  History of melanoma in situ Will continue to be followed by dermatology.  Paperwork filled out for prior authorization for Cialis.

## 2017-12-25 LAB — CBC WITH DIFFERENTIAL/PLATELET
Basophils Absolute: 0.1 10*3/uL (ref 0.0–0.2)
Basos: 1 %
EOS (ABSOLUTE): 0.2 10*3/uL (ref 0.0–0.4)
EOS: 2 %
HEMOGLOBIN: 15 g/dL (ref 13.0–17.7)
Hematocrit: 40.5 % (ref 37.5–51.0)
Immature Grans (Abs): 0 10*3/uL (ref 0.0–0.1)
Immature Granulocytes: 0 %
Lymphocytes Absolute: 1.6 10*3/uL (ref 0.7–3.1)
Lymphs: 24 %
MCH: 33.7 pg — ABNORMAL HIGH (ref 26.6–33.0)
MCHC: 37 g/dL — ABNORMAL HIGH (ref 31.5–35.7)
MCV: 91 fL (ref 79–97)
MONOCYTES: 9 %
Monocytes Absolute: 0.6 10*3/uL (ref 0.1–0.9)
NEUTROS ABS: 4.1 10*3/uL (ref 1.4–7.0)
Neutrophils: 64 %
Platelets: 294 10*3/uL (ref 150–450)
RBC: 4.45 x10E6/uL (ref 4.14–5.80)
RDW: 12.3 % (ref 12.3–15.4)
WBC: 6.4 10*3/uL (ref 3.4–10.8)

## 2017-12-25 LAB — PSA: Prostate Specific Ag, Serum: 0.5 ng/mL (ref 0.0–4.0)

## 2017-12-25 LAB — COMPREHENSIVE METABOLIC PANEL
ALBUMIN: 4.4 g/dL (ref 3.6–4.8)
ALT: 10 IU/L (ref 0–44)
AST: 16 IU/L (ref 0–40)
Albumin/Globulin Ratio: 1.8 (ref 1.2–2.2)
Alkaline Phosphatase: 64 IU/L (ref 39–117)
BUN/Creatinine Ratio: 10 (ref 10–24)
BUN: 6 mg/dL — ABNORMAL LOW (ref 8–27)
Bilirubin Total: 0.3 mg/dL (ref 0.0–1.2)
CALCIUM: 9.3 mg/dL (ref 8.6–10.2)
CO2: 23 mmol/L (ref 20–29)
CREATININE: 0.63 mg/dL — AB (ref 0.76–1.27)
Chloride: 94 mmol/L — ABNORMAL LOW (ref 96–106)
GFR, EST AFRICAN AMERICAN: 121 mL/min/{1.73_m2} (ref >59)
GFR, EST NON AFRICAN AMERICAN: 105 mL/min/{1.73_m2} (ref >59)
GLOBULIN, TOTAL: 2.4 g/dL (ref 1.5–4.5)
Glucose: 93 mg/dL (ref 65–99)
Potassium: 4.1 mmol/L (ref 3.5–5.2)
SODIUM: 132 mmol/L — AB (ref 134–144)
TOTAL PROTEIN: 6.8 g/dL (ref 6.0–8.5)

## 2017-12-25 LAB — LIPID PANEL
CHOL/HDL RATIO: 3 ratio (ref 0.0–5.0)
Cholesterol, Total: 191 mg/dL (ref 100–199)
HDL: 63 mg/dL (ref >39)
LDL Calculated: 98 mg/dL (ref 0–99)
TRIGLYCERIDES: 152 mg/dL — AB (ref 0–149)
VLDL Cholesterol Cal: 30 mg/dL (ref 5–40)

## 2017-12-30 ENCOUNTER — Telehealth: Payer: Self-pay | Admitting: Family Medicine

## 2017-12-30 NOTE — Telephone Encounter (Signed)
P.A. CIALIS 5mg  approved til 12/25/18 for 90 for 90

## 2018-01-02 DIAGNOSIS — Z1211 Encounter for screening for malignant neoplasm of colon: Secondary | ICD-10-CM | POA: Diagnosis not present

## 2018-01-03 DIAGNOSIS — D126 Benign neoplasm of colon, unspecified: Secondary | ICD-10-CM | POA: Insufficient documentation

## 2018-01-22 DIAGNOSIS — C4442 Squamous cell carcinoma of skin of scalp and neck: Secondary | ICD-10-CM | POA: Diagnosis not present

## 2018-01-22 DIAGNOSIS — Z85820 Personal history of malignant melanoma of skin: Secondary | ICD-10-CM | POA: Diagnosis not present

## 2018-01-22 DIAGNOSIS — D485 Neoplasm of uncertain behavior of skin: Secondary | ICD-10-CM | POA: Diagnosis not present

## 2018-01-22 DIAGNOSIS — D225 Melanocytic nevi of trunk: Secondary | ICD-10-CM | POA: Diagnosis not present

## 2018-02-04 DIAGNOSIS — K080 Exfoliation of teeth due to systemic causes: Secondary | ICD-10-CM | POA: Diagnosis not present

## 2018-03-07 DIAGNOSIS — Z1211 Encounter for screening for malignant neoplasm of colon: Secondary | ICD-10-CM | POA: Diagnosis not present

## 2018-03-07 DIAGNOSIS — K5730 Diverticulosis of large intestine without perforation or abscess without bleeding: Secondary | ICD-10-CM | POA: Diagnosis not present

## 2018-03-11 DIAGNOSIS — K5790 Diverticulosis of intestine, part unspecified, without perforation or abscess without bleeding: Secondary | ICD-10-CM | POA: Insufficient documentation

## 2018-03-12 DIAGNOSIS — L905 Scar conditions and fibrosis of skin: Secondary | ICD-10-CM | POA: Diagnosis not present

## 2018-03-12 DIAGNOSIS — D044 Carcinoma in situ of skin of scalp and neck: Secondary | ICD-10-CM | POA: Diagnosis not present

## 2018-03-18 ENCOUNTER — Encounter: Payer: Self-pay | Admitting: Family Medicine

## 2018-03-18 LAB — HM COLONOSCOPY

## 2018-05-06 DIAGNOSIS — K080 Exfoliation of teeth due to systemic causes: Secondary | ICD-10-CM | POA: Diagnosis not present

## 2018-07-12 DIAGNOSIS — L814 Other melanin hyperpigmentation: Secondary | ICD-10-CM | POA: Diagnosis not present

## 2018-07-12 DIAGNOSIS — L570 Actinic keratosis: Secondary | ICD-10-CM | POA: Diagnosis not present

## 2018-07-12 DIAGNOSIS — D229 Melanocytic nevi, unspecified: Secondary | ICD-10-CM | POA: Diagnosis not present

## 2018-07-12 DIAGNOSIS — L820 Inflamed seborrheic keratosis: Secondary | ICD-10-CM | POA: Diagnosis not present

## 2018-08-08 ENCOUNTER — Other Ambulatory Visit: Payer: Self-pay

## 2018-08-08 ENCOUNTER — Ambulatory Visit
Admission: RE | Admit: 2018-08-08 | Discharge: 2018-08-08 | Disposition: A | Discharge status: Home / Self Care | Payer: Federal, State, Local not specified - PPO | Source: Ambulatory Visit | Attending: Family Medicine | Admitting: Family Medicine

## 2018-08-08 ENCOUNTER — Ambulatory Visit: Payer: Federal, State, Local not specified - PPO | Admitting: Family Medicine

## 2018-08-08 VITALS — BP 122/80 | HR 83 | Temp 98.1°F | Wt 107.0 lb

## 2018-08-08 DIAGNOSIS — M50223 Other cervical disc displacement at C6-C7 level: Secondary | ICD-10-CM | POA: Diagnosis not present

## 2018-08-08 DIAGNOSIS — M4312 Spondylolisthesis, cervical region: Secondary | ICD-10-CM | POA: Diagnosis not present

## 2018-08-08 DIAGNOSIS — M50322 Other cervical disc degeneration at C5-C6 level: Secondary | ICD-10-CM | POA: Diagnosis not present

## 2018-08-08 DIAGNOSIS — M47812 Spondylosis without myelopathy or radiculopathy, cervical region: Secondary | ICD-10-CM

## 2018-08-08 MED ORDER — TRAMADOL HCL 50 MG PO TABS: 50.0000 mg | ORAL_TABLET | Freq: Three times a day (TID) | ORAL | 1 refills | Status: AC | PRN

## 2018-08-08 NOTE — Progress Notes (Signed)
   Subjective:    Patient ID: Jerry Douglas, male    DOB: 09-Jul-1955, 63 y.o.   MRN: [id number]  HPI He complains of difficulty with neck pain that is usually worse in the morning and does get better as the day goes on however recently he has noted increasing difficulty with the pain now interfering with sleep.  He has a previous history of C1 fracture in 2017.  He is having no numbness, tingling or weakness.   Review of Systems     Objective:   Physical Exam Alert and in no distress.  Pain on motion of his neck.  No palpable tenderness.  Normal motor, sensory and DTRs.       Assessment & Plan:  Cervical arthritis - Plan: DG Cervical Spine Complete, I will give him tramadol since he is having trouble with sleep.  Discussed possible referral to physical therapy to help with this.  Explained that this does not seem to be a surgical issue and he is comfortable with that.

## 2018-08-09 ENCOUNTER — Other Ambulatory Visit: Payer: Self-pay

## 2018-08-09 DIAGNOSIS — M47812 Spondylosis without myelopathy or radiculopathy, cervical region: Secondary | ICD-10-CM

## 2018-08-27 DIAGNOSIS — I10 Essential (primary) hypertension: Secondary | ICD-10-CM | POA: Diagnosis not present

## 2018-08-27 DIAGNOSIS — M542 Cervicalgia: Secondary | ICD-10-CM | POA: Diagnosis not present

## 2018-09-10 DIAGNOSIS — M542 Cervicalgia: Secondary | ICD-10-CM | POA: Diagnosis not present

## 2018-09-12 DIAGNOSIS — M542 Cervicalgia: Secondary | ICD-10-CM | POA: Diagnosis not present

## 2018-09-13 DIAGNOSIS — K080 Exfoliation of teeth due to systemic causes: Secondary | ICD-10-CM | POA: Diagnosis not present

## 2018-09-16 ENCOUNTER — Telehealth: Payer: Self-pay | Admitting: Family Medicine

## 2018-09-16 NOTE — Telephone Encounter (Signed)
Pt called for refills of Tramadol. Pt states that he spoke to Dr. Ronnald Ramp and he stated it is ok to use. Pt states he only takes one a night before bedtime. He is requesting a 90 day supply be sent to [facility]. Pt can be reached at [phone].

## 2018-09-16 NOTE — Telephone Encounter (Signed)
OK. Jerry Douglas

## 2018-09-16 NOTE — Telephone Encounter (Signed)
I discussed this with him.  I will have him use 800 mg of ibuprofen to see if that will help with his symptoms.  He will keep me informed

## 2018-12-23 ENCOUNTER — Encounter: Payer: Self-pay | Admitting: Family Medicine

## 2018-12-27 ENCOUNTER — Ambulatory Visit: Payer: Federal, State, Local not specified - PPO | Admitting: Family Medicine

## 2018-12-27 ENCOUNTER — Encounter: Payer: Self-pay | Admitting: Family Medicine

## 2018-12-27 ENCOUNTER — Other Ambulatory Visit: Payer: Self-pay

## 2018-12-27 VITALS — BP 122/80 | HR 75 | Temp 96.4°F | Ht 65.0 in | Wt 109.0 lb

## 2018-12-27 DIAGNOSIS — E871 Hypo-osmolality and hyponatremia: Secondary | ICD-10-CM | POA: Diagnosis not present

## 2018-12-27 DIAGNOSIS — M47812 Spondylosis without myelopathy or radiculopathy, cervical region: Secondary | ICD-10-CM

## 2018-12-27 DIAGNOSIS — N401 Enlarged prostate with lower urinary tract symptoms: Secondary | ICD-10-CM | POA: Diagnosis not present

## 2018-12-27 DIAGNOSIS — Z125 Encounter for screening for malignant neoplasm of prostate: Secondary | ICD-10-CM

## 2018-12-27 DIAGNOSIS — Z13220 Encounter for screening for lipoid disorders: Secondary | ICD-10-CM

## 2018-12-27 DIAGNOSIS — I10 Essential (primary) hypertension: Secondary | ICD-10-CM | POA: Diagnosis not present

## 2018-12-27 DIAGNOSIS — Z86006 Personal history of melanoma in-situ: Secondary | ICD-10-CM | POA: Diagnosis not present

## 2018-12-27 DIAGNOSIS — K5790 Diverticulosis of intestine, part unspecified, without perforation or abscess without bleeding: Secondary | ICD-10-CM

## 2018-12-27 DIAGNOSIS — Z9841 Cataract extraction status, right eye: Secondary | ICD-10-CM

## 2018-12-27 DIAGNOSIS — K649 Unspecified hemorrhoids: Secondary | ICD-10-CM

## 2018-12-27 DIAGNOSIS — Z0000 Encounter for general adult medical examination without abnormal findings: Secondary | ICD-10-CM | POA: Diagnosis not present

## 2018-12-27 DIAGNOSIS — Z23 Encounter for immunization: Secondary | ICD-10-CM | POA: Diagnosis not present

## 2018-12-27 DIAGNOSIS — Z87891 Personal history of nicotine dependence: Secondary | ICD-10-CM

## 2018-12-27 DIAGNOSIS — D126 Benign neoplasm of colon, unspecified: Secondary | ICD-10-CM

## 2018-12-27 DIAGNOSIS — R3912 Poor urinary stream: Secondary | ICD-10-CM

## 2018-12-27 MED ORDER — TADALAFIL 5 MG PO TABS: ORAL_TABLET | ORAL | 3 refills | Status: DC

## 2018-12-27 MED ORDER — LISINOPRIL-HYDROCHLOROTHIAZIDE 10-12.5 MG PO TABS: 1.0000 | ORAL_TABLET | Freq: Every day | ORAL | 3 refills | Status: DC

## 2018-12-27 NOTE — Progress Notes (Addendum)
   Subjective:    Patient ID: Jerry Douglas, male    DOB: 1955/04/18, 63 y.o.   MRN: [id number]  HPI He is here for complete examination.  He does have underlying hypertension and is doing well on his present medication.  He also has BPH and Cialis is helping him especially with his nocturia.  He has seen urology in the past for that.  He does have a history of adenomatous colonic polyp however his last colonoscopy was negative.  He was noted to have hemorrhoids as well as diverticulosis.  No problems with abdominal pain nausea or vomiting.  Has had right cataract removal.  He has a previous history of smoking but only a 10-pack-year history.  He sees dermatology for follow-up on melanoma in situ.  He has a previous history of cervical fracture and does have difficulty with arthritis from that.  Family and social history as well as health maintenance and immunizations was reviewed   Review of Systems  All other systems reviewed and are negative.      Objective:   Physical Exam Alert and in no distress. Tympanic membranes and canals are normal. Pharyngeal area is normal. Neck is supple without adenopathy or thyromegaly. Cardiac exam shows a regular sinus rhythm without murmurs or gallops. Lungs are clear to auscultation. Abdominal exam shows normal bowel sounds without hepatosplenomegaly or other masses.       Assessment & Plan:  Routine general medical examination at a health care facility - Plan: CBC with Differential/Platelet, Comprehensive metabolic panel, Lipid panel  Need for influenza vaccination - Plan: Flu Vaccine QUAD 36+ mos IM  Cervical arthritis  Essential hypertension - Plan: DISCONTINUED: lisinopril-hydrochlorothiazide (ZESTORETIC) 10-12.5 MG tablet  Benign prostatic hyperplasia with weak urinary stream - Plan: tadalafil (CIALIS) 5 MG tablet  History of melanoma in situ  Diverticulosis  Colon adenomas  Screening for prostate cancer - Plan: PSA  Hemorrhoids,  unspecified hemorrhoid type  S/P cataract extraction, right  Former light tobacco smoker - 10-pack-year history  Hyponatremia - Plan: Basic Metabolic Panel  Screening for lipid disorders  Discussed pneumonia injections as well as ultrasound for AAA next year.  He will continue on his present medication regimen and follow-up with GI and dermatology.  10/31 Blood work came back showed low sodium and chloride.  I will have him hold his diuretic) for blood draw in several days.  He is to increase his salt intake.

## 2018-12-28 LAB — COMPREHENSIVE METABOLIC PANEL
ALT: 13 IU/L (ref 0–44)
AST: 20 IU/L (ref 0–40)
Albumin/Globulin Ratio: 1.9 (ref 1.2–2.2)
Albumin: 4.5 g/dL (ref 3.8–4.8)
Alkaline Phosphatase: 66 IU/L (ref 39–117)
BUN/Creatinine Ratio: 9 — ABNORMAL LOW (ref 10–24)
BUN: 6 mg/dL — ABNORMAL LOW (ref 8–27)
Bilirubin Total: 0.4 mg/dL (ref 0.0–1.2)
CO2: 24 mmol/L (ref 20–29)
Calcium: 9.7 mg/dL (ref 8.6–10.2)
Chloride: 86 mmol/L — ABNORMAL LOW (ref 96–106)
Creatinine, Ser: 0.66 mg/dL — ABNORMAL LOW (ref 0.76–1.27)
GFR calc Af Amer: 118 mL/min/{1.73_m2} (ref >59)
GFR calc non Af Amer: 102 mL/min/{1.73_m2} (ref >59)
Globulin, Total: 2.4 g/dL (ref 1.5–4.5)
Glucose: 90 mg/dL (ref 65–99)
Potassium: 4 mmol/L (ref 3.5–5.2)
Sodium: 124 mmol/L — ABNORMAL LOW (ref 134–144)
Total Protein: 6.9 g/dL (ref 6.0–8.5)

## 2018-12-28 LAB — CBC WITH DIFFERENTIAL/PLATELET
Basophils Absolute: 0.1 10*3/uL (ref 0.0–0.2)
Basos: 1 %
EOS (ABSOLUTE): 0.1 10*3/uL (ref 0.0–0.4)
Eos: 2 %
Hematocrit: 43.5 % (ref 37.5–51.0)
Hemoglobin: 15.2 g/dL (ref 13.0–17.7)
Immature Grans (Abs): 0 10*3/uL (ref 0.0–0.1)
Immature Granulocytes: 0 %
Lymphocytes Absolute: 1.4 10*3/uL (ref 0.7–3.1)
Lymphs: 22 %
MCH: 33.2 pg — ABNORMAL HIGH (ref 26.6–33.0)
MCHC: 34.9 g/dL (ref 31.5–35.7)
MCV: 95 fL (ref 79–97)
Monocytes Absolute: 0.6 10*3/uL (ref 0.1–0.9)
Monocytes: 9 %
Neutrophils Absolute: 4.3 10*3/uL (ref 1.4–7.0)
Neutrophils: 66 %
Platelets: 318 10*3/uL (ref 150–450)
RBC: 4.58 x10E6/uL (ref 4.14–5.80)
RDW: 12.8 % (ref 11.6–15.4)
WBC: 6.4 10*3/uL (ref 3.4–10.8)

## 2018-12-28 LAB — PSA: Prostate Specific Ag, Serum: 0.4 ng/mL (ref 0.0–4.0)

## 2018-12-28 LAB — LIPID PANEL
Chol/HDL Ratio: 3.2 ratio (ref 0.0–5.0)
Cholesterol, Total: 220 mg/dL — ABNORMAL HIGH (ref 100–199)
HDL: 69 mg/dL (ref >39)
LDL Chol Calc (NIH): 133 mg/dL — ABNORMAL HIGH (ref 0–99)
Triglycerides: 101 mg/dL (ref 0–149)
VLDL Cholesterol Cal: 18 mg/dL (ref 5–40)

## 2018-12-28 NOTE — Addendum Note (Signed)
Addended by: Denita Lung on: 12/28/2018 10:11 AM   Modules accepted: Orders

## 2019-01-01 ENCOUNTER — Other Ambulatory Visit: Payer: Federal, State, Local not specified - PPO

## 2019-01-01 ENCOUNTER — Other Ambulatory Visit: Payer: Self-pay

## 2019-01-01 DIAGNOSIS — E871 Hypo-osmolality and hyponatremia: Secondary | ICD-10-CM | POA: Diagnosis not present

## 2019-01-02 ENCOUNTER — Encounter: Payer: Self-pay | Admitting: Family Medicine

## 2019-01-02 LAB — BASIC METABOLIC PANEL
BUN/Creatinine Ratio: 12 (ref 10–24)
BUN: 7 mg/dL — ABNORMAL LOW (ref 8–27)
CO2: 25 mmol/L (ref 20–29)
Calcium: 9.6 mg/dL (ref 8.6–10.2)
Chloride: 94 mmol/L — ABNORMAL LOW (ref 96–106)
Creatinine, Ser: 0.59 mg/dL — ABNORMAL LOW (ref 0.76–1.27)
GFR calc Af Amer: 124 mL/min/{1.73_m2} (ref >59)
GFR calc non Af Amer: 107 mL/min/{1.73_m2} (ref >59)
Glucose: 90 mg/dL (ref 65–99)
Potassium: 4 mmol/L (ref 3.5–5.2)
Sodium: 133 mmol/L — ABNORMAL LOW (ref 134–144)

## 2019-01-02 MED ORDER — LISINOPRIL 10 MG PO TABS: 10.0000 mg | ORAL_TABLET | Freq: Every day | ORAL | 3 refills | Status: DC

## 2019-01-04 ENCOUNTER — Telehealth: Payer: Self-pay | Admitting: Family Medicine

## 2019-01-04 NOTE — Telephone Encounter (Signed)
P.A. approved til 01/03/20

## 2019-01-04 NOTE — Telephone Encounter (Signed)
P.A. TADALAFIL renewal

## 2019-01-06 NOTE — Telephone Encounter (Signed)
Left message for pt

## 2019-01-31 DIAGNOSIS — L821 Other seborrheic keratosis: Secondary | ICD-10-CM | POA: Diagnosis not present

## 2019-01-31 DIAGNOSIS — Z85820 Personal history of malignant melanoma of skin: Secondary | ICD-10-CM | POA: Diagnosis not present

## 2019-01-31 DIAGNOSIS — D229 Melanocytic nevi, unspecified: Secondary | ICD-10-CM | POA: Diagnosis not present

## 2019-01-31 DIAGNOSIS — L738 Other specified follicular disorders: Secondary | ICD-10-CM | POA: Diagnosis not present

## 2019-01-31 DIAGNOSIS — D692 Other nonthrombocytopenic purpura: Secondary | ICD-10-CM | POA: Diagnosis not present

## 2019-01-31 DIAGNOSIS — D485 Neoplasm of uncertain behavior of skin: Secondary | ICD-10-CM | POA: Diagnosis not present

## 2019-01-31 HISTORY — PX: OTHER SURGICAL HISTORY: SHX169

## 2019-02-11 ENCOUNTER — Encounter: Payer: Self-pay | Admitting: Family Medicine

## 2019-03-10 DIAGNOSIS — K080 Exfoliation of teeth due to systemic causes: Secondary | ICD-10-CM | POA: Diagnosis not present

## 2019-07-29 DIAGNOSIS — L905 Scar conditions and fibrosis of skin: Secondary | ICD-10-CM | POA: Diagnosis not present

## 2019-07-29 DIAGNOSIS — L570 Actinic keratosis: Secondary | ICD-10-CM | POA: Diagnosis not present

## 2019-07-29 DIAGNOSIS — D229 Melanocytic nevi, unspecified: Secondary | ICD-10-CM | POA: Diagnosis not present

## 2019-07-29 DIAGNOSIS — Z85820 Personal history of malignant melanoma of skin: Secondary | ICD-10-CM | POA: Diagnosis not present

## 2019-07-29 DIAGNOSIS — Z85828 Personal history of other malignant neoplasm of skin: Secondary | ICD-10-CM | POA: Diagnosis not present

## 2019-11-24 ENCOUNTER — Encounter: Payer: Self-pay | Admitting: Family Medicine

## 2019-12-04 ENCOUNTER — Ambulatory Visit (INDEPENDENT_AMBULATORY_CARE_PROVIDER_SITE_OTHER): Payer: Federal, State, Local not specified - PPO

## 2019-12-04 ENCOUNTER — Other Ambulatory Visit: Payer: Federal, State, Local not specified - PPO

## 2019-12-04 ENCOUNTER — Other Ambulatory Visit: Payer: Self-pay

## 2019-12-04 DIAGNOSIS — Z23 Encounter for immunization: Secondary | ICD-10-CM

## 2019-12-05 IMAGING — DX CERVICAL SPINE - COMPLETE 4+ VIEW
7 series · 7 of 7 positions shown · non-contrast
Comparison: 12/16/2015, CT 12/16/2015

CLINICAL DATA: 62-year-old male with a history of posterior
cervical pain

EXAM:
CERVICAL SPINE - COMPLETE 4+ VIEW

[dg cervical spine complete (1 of 7)]
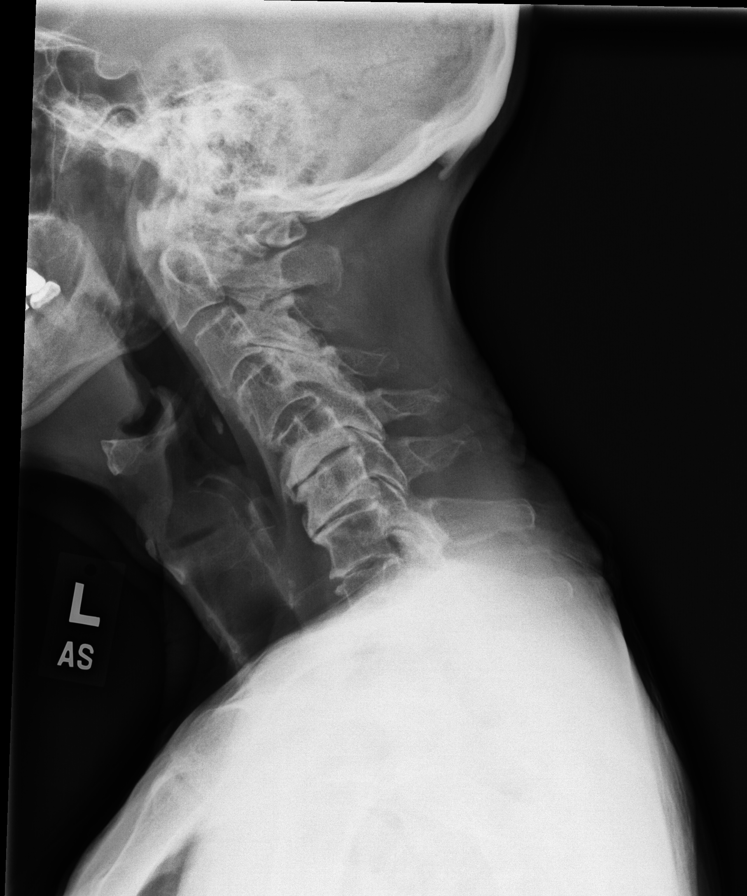

[dg cervical spine complete (2 of 7)]
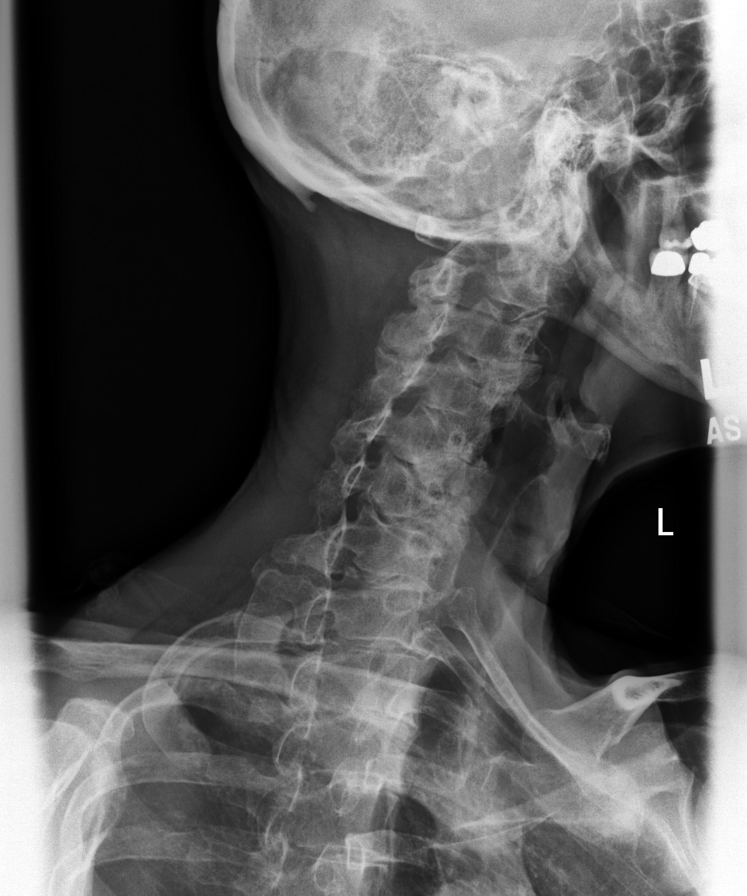

[dg cervical spine complete (3 of 7)]
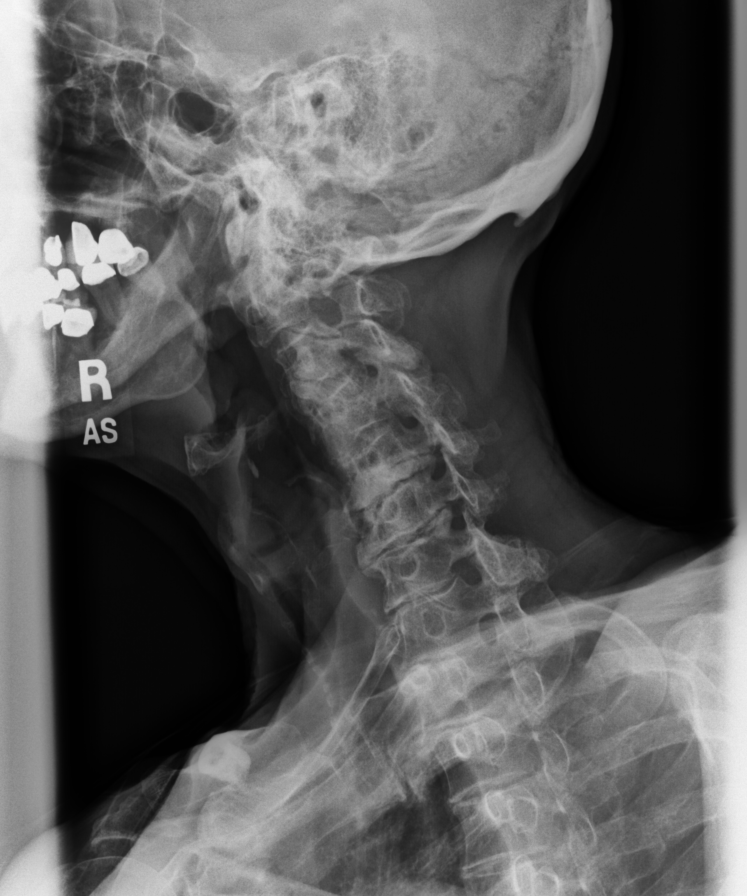

[dg cervical spine complete (4 of 7)]
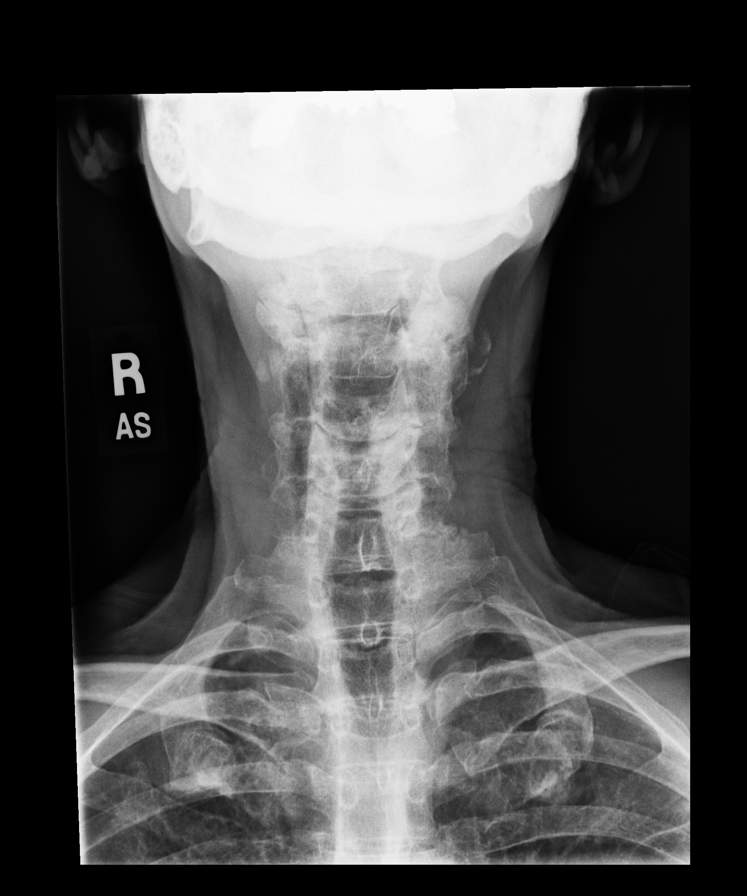

[dg cervical spine complete (5 of 7)]
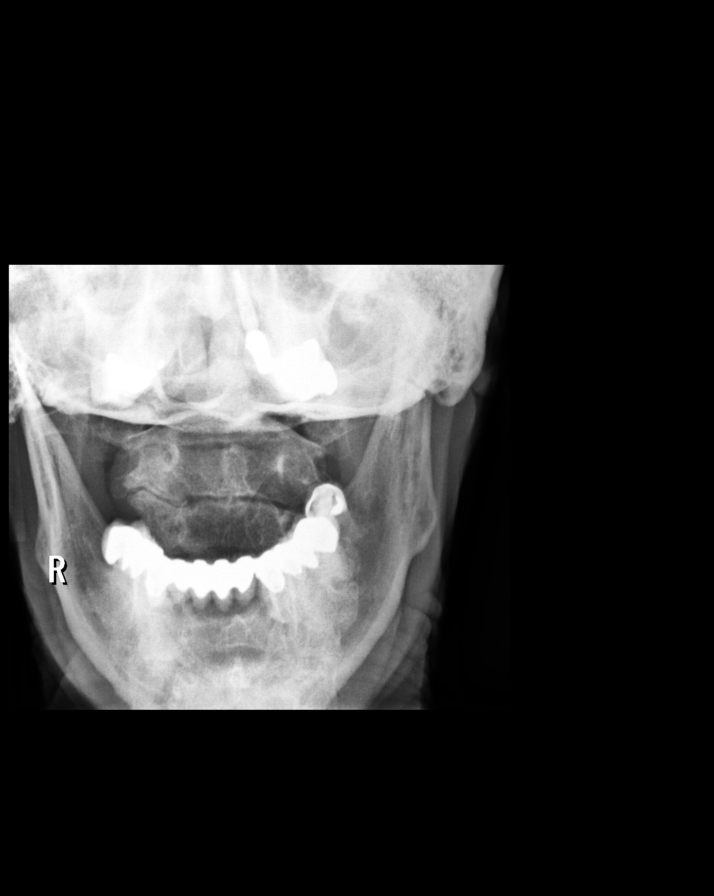

[dg cervical spine complete (6 of 7)]
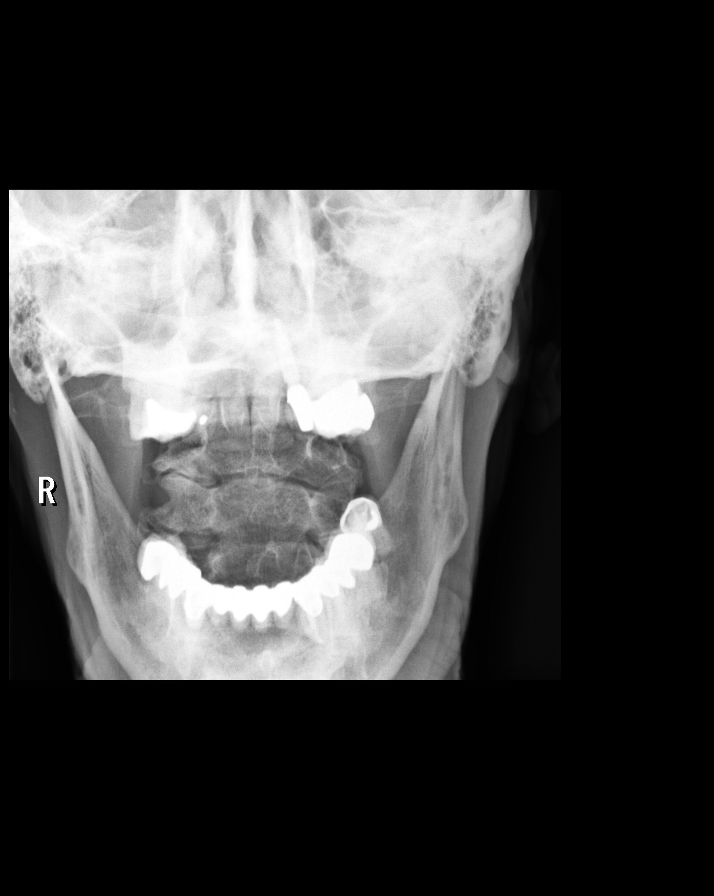

[dg cervical spine complete (7 of 7)]
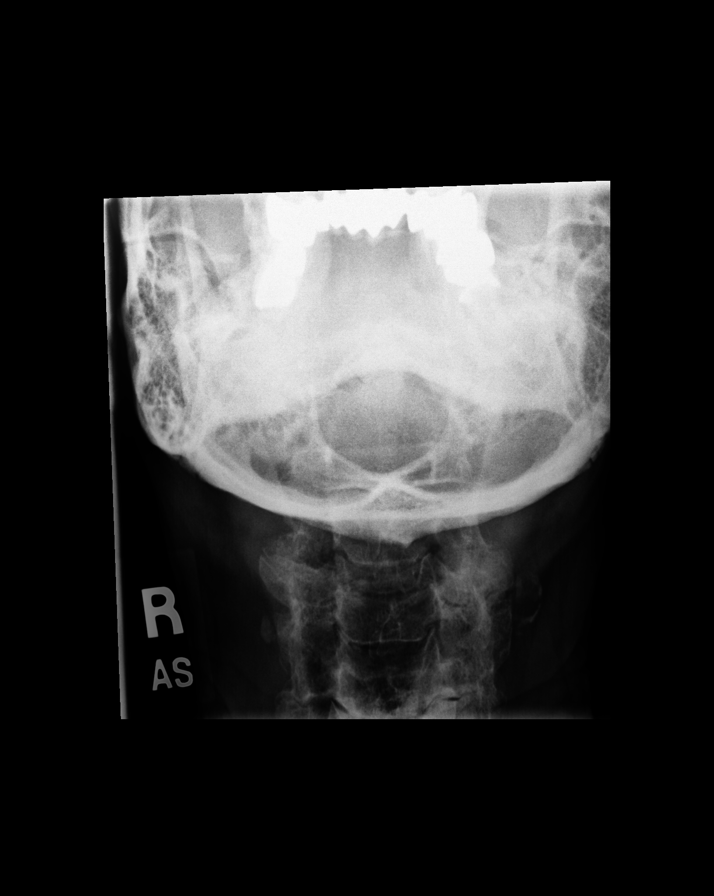

[7 of 7 positions shown; findings below may reference images not displayed]

FINDINGS: Cervical Spine:

Cervical elements maintain similar alignment compared to the prior
plain film. Reversal normal cervical lordosis with mild focal
kyphosis centered at C5. No subluxation. There is trace
anterolisthesis persisting at C3-C4.

Right oblique images demonstrate uncovertebral joint disease, with
early foraminal encroachment at C3-C4 secondary to facet hypertrophy
and uncovertebral joint disease. Additional levels of uncovertebral
joint disease without significant foraminal encroachment at C5-C6,
C6-C7, and C7-T1.

Left oblique images demonstrates foraminal encroachment at C3-C4
secondary to uncovertebral joint disease and facet disease. Less
degree of foraminal encroachment at C4-C5, C5-C6, C6-C7 secondary to
uncovertebral joint disease and facet disease.

Open mouth odontoid view/transforaminal views unremarkable.

Multilevel degenerative disc disease with greatest degree of disc
space narrowing at C5-C6, C6-C7, C7-T1 with endplate sclerosis,
anterior osteophyte production, and uncovertebral joint disease.

Bilateral facet hypertrophy.

Prevertebral soft tissues within normal limits.
IMPRESSION: No acute fracture or malalignment of the cervical spine.

Similar degree of trace anterolisthesis of C3 on C4 compared to the
prior.

Oblique images demonstrate developing foraminal encroachment
bilaterally at C3-C4, worst on the left. Additionally there are more
mild levels of bilateral foraminal encroachment, secondary to
uncovertebral joint disease and facet disease, as above.

Multilevel degenerative disc disease worst at the levels of C5-C6
and C6-C7.

## 2019-12-30 ENCOUNTER — Encounter: Payer: Self-pay | Admitting: Family Medicine

## 2019-12-30 ENCOUNTER — Ambulatory Visit (INDEPENDENT_AMBULATORY_CARE_PROVIDER_SITE_OTHER): Payer: Federal, State, Local not specified - PPO | Admitting: Family Medicine

## 2019-12-30 ENCOUNTER — Other Ambulatory Visit: Payer: Self-pay

## 2019-12-30 VITALS — BP 132/80 | HR 81 | Temp 97.7°F | Ht 64.5 in | Wt 107.2 lb

## 2019-12-30 DIAGNOSIS — Z8249 Family history of ischemic heart disease and other diseases of the circulatory system: Secondary | ICD-10-CM | POA: Diagnosis not present

## 2019-12-30 DIAGNOSIS — K5790 Diverticulosis of intestine, part unspecified, without perforation or abscess without bleeding: Secondary | ICD-10-CM

## 2019-12-30 DIAGNOSIS — D692 Other nonthrombocytopenic purpura: Secondary | ICD-10-CM

## 2019-12-30 DIAGNOSIS — Z9841 Cataract extraction status, right eye: Secondary | ICD-10-CM

## 2019-12-30 DIAGNOSIS — M47812 Spondylosis without myelopathy or radiculopathy, cervical region: Secondary | ICD-10-CM

## 2019-12-30 DIAGNOSIS — R3912 Poor urinary stream: Secondary | ICD-10-CM

## 2019-12-30 DIAGNOSIS — D126 Benign neoplasm of colon, unspecified: Secondary | ICD-10-CM

## 2019-12-30 DIAGNOSIS — N486 Induration penis plastica: Secondary | ICD-10-CM

## 2019-12-30 DIAGNOSIS — Z23 Encounter for immunization: Secondary | ICD-10-CM

## 2019-12-30 DIAGNOSIS — Z86006 Personal history of melanoma in-situ: Secondary | ICD-10-CM

## 2019-12-30 DIAGNOSIS — N401 Enlarged prostate with lower urinary tract symptoms: Secondary | ICD-10-CM

## 2019-12-30 DIAGNOSIS — K649 Unspecified hemorrhoids: Secondary | ICD-10-CM

## 2019-12-30 DIAGNOSIS — I10 Essential (primary) hypertension: Secondary | ICD-10-CM | POA: Diagnosis not present

## 2019-12-30 DIAGNOSIS — Z125 Encounter for screening for malignant neoplasm of prostate: Secondary | ICD-10-CM

## 2019-12-30 DIAGNOSIS — Z0000 Encounter for general adult medical examination without abnormal findings: Secondary | ICD-10-CM

## 2019-12-30 LAB — LIPID PANEL

## 2019-12-30 MED ORDER — LISINOPRIL 10 MG PO TABS: 10.0000 mg | ORAL_TABLET | Freq: Every day | ORAL | 3 refills | Status: DC

## 2019-12-30 MED ORDER — TADALAFIL 5 MG PO TABS: ORAL_TABLET | ORAL | 3 refills | Status: DC

## 2019-12-30 NOTE — Progress Notes (Signed)
   Subjective:    Patient ID: Jerry Douglas, male    DOB: 1955-12-31, 64 y.o.   MRN: [id number]  HPI He is here for complete examination.  He does have hypertension and continues on lisinopril.  He is also taking Cialis for his BPH.  He does have a history of nocturia, urgency, incomplete emptying and decreased stream.  He does have Peyronie's disease and still has a slight curvature of his penis.  He sees dermatology every 6 months for follow-up on his melanoma.  He has had a colonoscopy and apparently most recent one was clear with return in 7 years.  He does have a history of diverticulosis and hemorrhoids but is having no difficulty with use of them.  Continues to have neck arthritis but he seems to have this under adequate control.  There is a family history of AAA with repair by several members on his mother side of the family.  He is retired.  His spouse of over 68 years is having some difficulty with prostate cancer.   Review of Systems  All other systems reviewed and are negative.      Objective:   Physical Exam Alert and in no distress. Tympanic membranes and canals are normal. Pharyngeal area is normal. Neck is supple without adenopathy or thyromegaly.  Purpuric lesions noted on forearms.  Cardiac exam shows a regular sinus rhythm without murmurs or gallops. Lungs are clear to auscultation.  Abdominal exam shows no masses or tenderness with normal bowel sounds       Assessment & Plan:  Routine general medical examination at a health care facility  Family history of abdominal aortic aneurysm (AAA) ultrasound ordered  Essential hypertension: Continue lisinopril  Benign prostatic hyperplasia with weak urinary stream; continue Cialis History of melanoma in situ: Continue follow-up with dermatology  Diverticulosis: Status quo Screening for prostate cancer: PSA  S/P cataract extraction, right: Continue follow-up with ophthalmology  Senile purpura ([place]), Chronic: No  therapy  Peyronie's disease: Follow-up as needed  Colon adenomas; colonoscopy 7 years  Cervical arthritis; continue conservative care  Hemorrhoids, unspecified hemorrhoid type: No intervention  Need for vaccination against Streptococcus pneumoniae

## 2019-12-31 LAB — CBC WITH DIFFERENTIAL/PLATELET
Basophils Absolute: 0.1 10*3/uL (ref 0.0–0.2)
Basos: 1 %
EOS (ABSOLUTE): 0.2 10*3/uL (ref 0.0–0.4)
Eos: 3 %
Hematocrit: 42.4 % (ref 37.5–51.0)
Hemoglobin: 14.7 g/dL (ref 13.0–17.7)
Immature Grans (Abs): 0 10*3/uL (ref 0.0–0.1)
Immature Granulocytes: 0 %
Lymphocytes Absolute: 1.7 10*3/uL (ref 0.7–3.1)
Lymphs: 22 %
MCH: 32.6 pg (ref 26.6–33.0)
MCHC: 34.7 g/dL (ref 31.5–35.7)
MCV: 94 fL (ref 79–97)
Monocytes Absolute: 0.6 10*3/uL (ref 0.1–0.9)
Monocytes: 8 %
Neutrophils Absolute: 5.1 10*3/uL (ref 1.4–7.0)
Neutrophils: 66 %
Platelets: 292 10*3/uL (ref 150–450)
RBC: 4.51 x10E6/uL (ref 4.14–5.80)
RDW: 12.1 % (ref 11.6–15.4)
WBC: 7.8 10*3/uL (ref 3.4–10.8)

## 2019-12-31 LAB — COMPREHENSIVE METABOLIC PANEL
ALT: 12 IU/L (ref 0–44)
AST: 19 IU/L (ref 0–40)
Albumin/Globulin Ratio: 1.7 (ref 1.2–2.2)
Albumin: 4.3 g/dL (ref 3.8–4.8)
Alkaline Phosphatase: 66 IU/L (ref 44–121)
BUN/Creatinine Ratio: 15 (ref 10–24)
BUN: 10 mg/dL (ref 8–27)
Bilirubin Total: 0.4 mg/dL (ref 0.0–1.2)
CO2: 24 mmol/L (ref 20–29)
Calcium: 9 mg/dL (ref 8.6–10.2)
Chloride: 97 mmol/L (ref 96–106)
Creatinine, Ser: 0.67 mg/dL — ABNORMAL LOW (ref 0.76–1.27)
GFR calc Af Amer: 117 mL/min/{1.73_m2} (ref >59)
GFR calc non Af Amer: 101 mL/min/{1.73_m2} (ref >59)
Globulin, Total: 2.5 g/dL (ref 1.5–4.5)
Glucose: 99 mg/dL (ref 65–99)
Potassium: 5.5 mmol/L — ABNORMAL HIGH (ref 3.5–5.2)
Sodium: 133 mmol/L — ABNORMAL LOW (ref 134–144)
Total Protein: 6.8 g/dL (ref 6.0–8.5)

## 2019-12-31 LAB — LIPID PANEL
Chol/HDL Ratio: 3 ratio (ref 0.0–5.0)
Cholesterol, Total: 209 mg/dL — ABNORMAL HIGH (ref 100–199)
HDL: 69 mg/dL (ref >39)
LDL Chol Calc (NIH): 131 mg/dL — ABNORMAL HIGH (ref 0–99)
Triglycerides: 52 mg/dL (ref 0–149)
VLDL Cholesterol Cal: 9 mg/dL (ref 5–40)

## 2019-12-31 LAB — PSA: Prostate Specific Ag, Serum: 0.5 ng/mL (ref 0.0–4.0)

## 2020-01-02 ENCOUNTER — Telehealth: Payer: Self-pay

## 2020-01-02 NOTE — Telephone Encounter (Signed)
P.A. TADALFIL  

## 2020-01-08 NOTE — Telephone Encounter (Signed)
PA approved, pt informed.

## 2020-01-16 ENCOUNTER — Inpatient Hospital Stay: Admission: RE | Admit: 2020-01-16 | Payer: Federal, State, Local not specified - PPO | Source: Ambulatory Visit

## 2020-01-20 ENCOUNTER — Ambulatory Visit
Admission: RE | Admit: 2020-01-20 | Discharge: 2020-01-20 | Disposition: A | Discharge status: Home / Self Care | Payer: Federal, State, Local not specified - PPO | Source: Ambulatory Visit | Attending: Family Medicine | Admitting: Family Medicine

## 2020-01-27 ENCOUNTER — Encounter: Payer: Self-pay | Admitting: Family Medicine

## 2020-07-15 ENCOUNTER — Other Ambulatory Visit: Payer: Self-pay

## 2020-07-15 ENCOUNTER — Ambulatory Visit (INDEPENDENT_AMBULATORY_CARE_PROVIDER_SITE_OTHER): Payer: Federal, State, Local not specified - PPO

## 2020-07-15 DIAGNOSIS — Z23 Encounter for immunization: Secondary | ICD-10-CM | POA: Diagnosis not present

## 2020-12-01 ENCOUNTER — Other Ambulatory Visit (INDEPENDENT_AMBULATORY_CARE_PROVIDER_SITE_OTHER): Payer: Federal, State, Local not specified - PPO

## 2020-12-01 ENCOUNTER — Other Ambulatory Visit: Payer: Self-pay

## 2020-12-01 DIAGNOSIS — Z23 Encounter for immunization: Secondary | ICD-10-CM

## 2021-01-04 ENCOUNTER — Encounter: Payer: Self-pay | Admitting: Family Medicine

## 2021-01-04 ENCOUNTER — Ambulatory Visit: Payer: Federal, State, Local not specified - PPO | Admitting: Family Medicine

## 2021-01-04 ENCOUNTER — Other Ambulatory Visit: Payer: Self-pay

## 2021-01-04 VITALS — BP 128/76 | HR 72 | Temp 96.6°F | Ht 64.0 in | Wt 105.0 lb

## 2021-01-04 DIAGNOSIS — M47812 Spondylosis without myelopathy or radiculopathy, cervical region: Secondary | ICD-10-CM | POA: Diagnosis not present

## 2021-01-04 DIAGNOSIS — N401 Enlarged prostate with lower urinary tract symptoms: Secondary | ICD-10-CM | POA: Diagnosis not present

## 2021-01-04 DIAGNOSIS — Z0000 Encounter for general adult medical examination without abnormal findings: Secondary | ICD-10-CM | POA: Diagnosis not present

## 2021-01-04 DIAGNOSIS — D126 Benign neoplasm of colon, unspecified: Secondary | ICD-10-CM | POA: Diagnosis not present

## 2021-01-04 DIAGNOSIS — I10 Essential (primary) hypertension: Secondary | ICD-10-CM

## 2021-01-04 DIAGNOSIS — Z9841 Cataract extraction status, right eye: Secondary | ICD-10-CM

## 2021-01-04 DIAGNOSIS — D692 Other nonthrombocytopenic purpura: Secondary | ICD-10-CM

## 2021-01-04 DIAGNOSIS — Z86006 Personal history of melanoma in-situ: Secondary | ICD-10-CM | POA: Diagnosis not present

## 2021-01-04 DIAGNOSIS — R3912 Poor urinary stream: Secondary | ICD-10-CM

## 2021-01-04 DIAGNOSIS — Z23 Encounter for immunization: Secondary | ICD-10-CM

## 2021-01-04 LAB — POCT URINALYSIS DIP (PROADVANTAGE DEVICE)
Bilirubin, UA: NEGATIVE
Glucose, UA: NEGATIVE mg/dL
Ketones, POC UA: NEGATIVE mg/dL
Leukocytes, UA: NEGATIVE
Nitrite, UA: NEGATIVE
Protein Ur, POC: NEGATIVE mg/dL
Specific Gravity, Urine: 1.01
Urobilinogen, Ur: 0.2
pH, UA: 7 (ref 5.0–8.0)

## 2021-01-04 MED ORDER — LISINOPRIL 10 MG PO TABS: 10.0000 mg | ORAL_TABLET | Freq: Every day | ORAL | 3 refills | Status: DC

## 2021-01-04 MED ORDER — TADALAFIL 5 MG PO TABS: ORAL_TABLET | ORAL | 3 refills | Status: DC

## 2021-01-04 NOTE — Progress Notes (Signed)
   Subjective:    Patient ID: Jerry Douglas, male    DOB: Nov 13, 1955, 65 y.o.   MRN: [id number]  HPI He is here for complete examination.  He has no particular concerns or complaints.  He continues on Cialis to help with his BPH symptoms which seems to be doing quite nicely.  Continues on low-dose lisinopril for his blood pressure.  Is also taking a multivitamin.  He sees dermatology regularly for melanoma surveillance.  He has had a colonoscopy and is scheduled on 7-year cycle.  He did have 1 cataract removed but has not needed the other one taken care of.  He does have arthritis but states he is taking care of this with OTC medications.  He does not smoke or drink.  He is in a 40+ year relationship which is going well.  He is retired and enjoying his retirement.  Family and social history as well as health maintenance and immunizations was reviewed   Review of Systems  All other systems reviewed and are negative.     Objective:   Physical Exam Alert and in no distress. Tympanic membranes and canals are normal. Pharyngeal area is normal. Neck is supple without adenopathy or thyromegaly. Cardiac exam shows a regular sinus rhythm without murmurs or gallops. Lungs are clear to auscultation.  Exam of both forearms does show purpuric lesions. The urine dipstick showed red cells however the microscopic was negative.       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  He will follow-up with dermatology regularly as well as GI.  Continue with conservative care for his arthritis and no therapy needed for the senile purpura.  Review of the record indicates he actually got 1 more Shingrix vaccine and needed.  He had no difficulty with the second 1 in October.  This was explained to him.  He was comfortable with this.

## 2021-01-06 LAB — CBC WITH DIFFERENTIAL/PLATELET
Basophils Absolute: 0.1 10*3/uL (ref 0.0–0.2)
Basos: 1 %
EOS (ABSOLUTE): 0.2 10*3/uL (ref 0.0–0.4)
Eos: 3 %
Hematocrit: 44.1 % (ref 37.5–51.0)
Hemoglobin: 15.3 g/dL (ref 13.0–17.7)
Immature Grans (Abs): 0 10*3/uL (ref 0.0–0.1)
Immature Granulocytes: 0 %
Lymphocytes Absolute: 1.9 10*3/uL (ref 0.7–3.1)
Lymphs: 27 %
MCH: 33.2 pg — ABNORMAL HIGH (ref 26.6–33.0)
MCHC: 34.7 g/dL (ref 31.5–35.7)
MCV: 96 fL (ref 79–97)
Monocytes Absolute: 0.6 10*3/uL (ref 0.1–0.9)
Monocytes: 8 %
Neutrophils Absolute: 4.2 10*3/uL (ref 1.4–7.0)
Neutrophils: 61 %
Platelets: 291 10*3/uL (ref 150–450)
RBC: 4.61 x10E6/uL (ref 4.14–5.80)
RDW: 12.3 % (ref 11.6–15.4)
WBC: 6.9 10*3/uL (ref 3.4–10.8)

## 2021-01-06 LAB — LIPID PANEL
Chol/HDL Ratio: 2.6 ratio (ref 0.0–5.0)
Cholesterol, Total: 203 mg/dL — ABNORMAL HIGH (ref 100–199)
HDL: 77 mg/dL (ref >39)
LDL Chol Calc (NIH): 114 mg/dL — ABNORMAL HIGH (ref 0–99)
Triglycerides: 66 mg/dL (ref 0–149)
VLDL Cholesterol Cal: 12 mg/dL (ref 5–40)

## 2021-01-06 LAB — COMPREHENSIVE METABOLIC PANEL
ALT: 11 IU/L (ref 0–44)
AST: 16 IU/L (ref 0–40)
Albumin/Globulin Ratio: 2.2 (ref 1.2–2.2)
Albumin: 4.7 g/dL (ref 3.8–4.8)
Alkaline Phosphatase: 75 IU/L (ref 44–121)
BUN/Creatinine Ratio: 13 (ref 10–24)
BUN: 8 mg/dL (ref 8–27)
Bilirubin Total: <0.2 mg/dL (ref 0.0–1.2)
CO2: 21 mmol/L (ref 20–29)
Calcium: 9.9 mg/dL (ref 8.6–10.2)
Chloride: 98 mmol/L (ref 96–106)
Creatinine, Ser: 0.6 mg/dL — ABNORMAL LOW (ref 0.76–1.27)
Globulin, Total: 2.1 g/dL (ref 1.5–4.5)
Glucose: 97 mg/dL (ref 70–99)
Potassium: 5.2 mmol/L (ref 3.5–5.2)
Sodium: 138 mmol/L (ref 134–144)
Total Protein: 6.8 g/dL (ref 6.0–8.5)
eGFR: 106 mL/min/{1.73_m2} (ref >59)

## 2021-01-09 ENCOUNTER — Telehealth: Payer: Self-pay

## 2021-01-09 NOTE — Telephone Encounter (Signed)
P.A. TADALAFIL 

## 2021-01-15 NOTE — Telephone Encounter (Signed)
P.A. approved til 01/05/22

## 2021-02-02 ENCOUNTER — Other Ambulatory Visit (INDEPENDENT_AMBULATORY_CARE_PROVIDER_SITE_OTHER): Payer: Federal, State, Local not specified - PPO

## 2021-02-02 ENCOUNTER — Other Ambulatory Visit: Payer: Self-pay

## 2021-02-02 DIAGNOSIS — Z23 Encounter for immunization: Secondary | ICD-10-CM

## 2021-02-03 HISTORY — PX: SKIN BIOPSY: SHX1

## 2021-06-15 HISTORY — PX: SKIN BIOPSY: SHX1

## 2021-07-08 ENCOUNTER — Encounter: Payer: Self-pay | Admitting: Family Medicine

## 2021-11-02 ENCOUNTER — Encounter: Payer: Self-pay | Admitting: Internal Medicine

## 2021-11-15 ENCOUNTER — Other Ambulatory Visit (INDEPENDENT_AMBULATORY_CARE_PROVIDER_SITE_OTHER): Payer: Federal, State, Local not specified - PPO

## 2021-11-15 DIAGNOSIS — Z23 Encounter for immunization: Secondary | ICD-10-CM

## 2021-12-06 ENCOUNTER — Encounter: Payer: Self-pay | Admitting: Internal Medicine

## 2021-12-15 ENCOUNTER — Other Ambulatory Visit (INDEPENDENT_AMBULATORY_CARE_PROVIDER_SITE_OTHER): Payer: Federal, State, Local not specified - PPO

## 2021-12-15 DIAGNOSIS — Z23 Encounter for immunization: Secondary | ICD-10-CM

## 2021-12-19 ENCOUNTER — Encounter: Payer: Self-pay | Admitting: Internal Medicine

## 2021-12-31 ENCOUNTER — Telehealth: Payer: Self-pay | Admitting: Family Medicine

## 2021-12-31 NOTE — Telephone Encounter (Signed)
P.A. TADALAFIL 

## 2022-01-05 ENCOUNTER — Other Ambulatory Visit: Payer: Self-pay | Admitting: Family Medicine

## 2022-01-05 DIAGNOSIS — I10 Essential (primary) hypertension: Secondary | ICD-10-CM

## 2022-01-13 NOTE — Telephone Encounter (Signed)
P.A. approved til 12/31/22 sent mychart message

## 2022-01-18 ENCOUNTER — Ambulatory Visit: Payer: Federal, State, Local not specified - PPO | Admitting: Family Medicine

## 2022-01-18 ENCOUNTER — Encounter: Payer: Self-pay | Admitting: Family Medicine

## 2022-01-18 VITALS — BP 120/78 | HR 66 | Ht 65.0 in | Wt 108.8 lb

## 2022-01-18 DIAGNOSIS — N401 Enlarged prostate with lower urinary tract symptoms: Secondary | ICD-10-CM

## 2022-01-18 DIAGNOSIS — I10 Essential (primary) hypertension: Secondary | ICD-10-CM | POA: Diagnosis not present

## 2022-01-18 DIAGNOSIS — M47812 Spondylosis without myelopathy or radiculopathy, cervical region: Secondary | ICD-10-CM

## 2022-01-18 DIAGNOSIS — K5790 Diverticulosis of intestine, part unspecified, without perforation or abscess without bleeding: Secondary | ICD-10-CM

## 2022-01-18 DIAGNOSIS — Z9841 Cataract extraction status, right eye: Secondary | ICD-10-CM

## 2022-01-18 DIAGNOSIS — D692 Other nonthrombocytopenic purpura: Secondary | ICD-10-CM | POA: Diagnosis not present

## 2022-01-18 DIAGNOSIS — N486 Induration penis plastica: Secondary | ICD-10-CM

## 2022-01-18 DIAGNOSIS — K649 Unspecified hemorrhoids: Secondary | ICD-10-CM

## 2022-01-18 DIAGNOSIS — D126 Benign neoplasm of colon, unspecified: Secondary | ICD-10-CM | POA: Diagnosis not present

## 2022-01-18 DIAGNOSIS — E785 Hyperlipidemia, unspecified: Secondary | ICD-10-CM

## 2022-01-18 DIAGNOSIS — Z9189 Other specified personal risk factors, not elsewhere classified: Secondary | ICD-10-CM

## 2022-01-18 DIAGNOSIS — Z0000 Encounter for general adult medical examination without abnormal findings: Secondary | ICD-10-CM | POA: Diagnosis not present

## 2022-01-18 DIAGNOSIS — R3912 Poor urinary stream: Secondary | ICD-10-CM

## 2022-01-18 DIAGNOSIS — Z125 Encounter for screening for malignant neoplasm of prostate: Secondary | ICD-10-CM

## 2022-01-18 DIAGNOSIS — Z86006 Personal history of melanoma in-situ: Secondary | ICD-10-CM

## 2022-01-18 DIAGNOSIS — Z8249 Family history of ischemic heart disease and other diseases of the circulatory system: Secondary | ICD-10-CM

## 2022-01-18 MED ORDER — TADALAFIL 5 MG PO TABS: ORAL_TABLET | ORAL | 3 refills | Status: DC

## 2022-01-18 MED ORDER — LISINOPRIL 10 MG PO TABS: 10.0000 mg | ORAL_TABLET | Freq: Every day | ORAL | 3 refills | Status: DC

## 2022-01-18 MED ORDER — ATORVASTATIN CALCIUM 20 MG PO TABS: 20.0000 mg | ORAL_TABLET | Freq: Every day | ORAL | 3 refills | Status: DC

## 2022-01-18 NOTE — Progress Notes (Signed)
   Subjective:    Patient ID: Jerry Douglas, male    DOB: 1955/03/03, 66 y.o.   MRN: [id number]  HPI He is here for complete examination.  He is up-to-date with his immunizations except for RSV which he plans to get.  He continues on lisinopril and is having no difficulty with that.  He does have a history of BPH as well as ED and Cialis works very nicely for that.  He does have Peyronie's disease and is been seen in the past by urology.  Presently he is on no therapies for this other than vitamin E.he takes multivitamins.  He does have a previous history of colonic polyps however his last colonoscopy was negative and he was told to repeat this in 10 years.  He does have evidence of senile purpura.  He also has difficulty with arthritis but seems to be handling this fairly well.  He and his partner of over 61 years finally got married.  Otherwise his family and social history as well as health maintenance and immunizations was reviewed.   Review of Systems The 10-year ASCVD risk score (Arnett DK, et al., 2019) is: 12.4%   Values used to calculate the score:     Age: 35 years     Sex: Male     Is Non-Hispanic African American: No     Diabetic: No     Tobacco smoker: No     Systolic Blood Pressure: 542 mmHg     Is BP treated: Yes     HDL Cholesterol: 77 mg/dL     Total Cholesterol: 203 mg/dL  Objective:   Physical Exam Alert and in no distress. Tympanic membranes and canals are normal. Pharyngeal area is normal. Neck is supple without adenopathy or thyromegaly. Cardiac exam shows a regular sinus rhythm without murmurs or gallops. Lungs are clear to auscultation. Exam shows no masses or tenderness.       Assessment & Plan:  Routine general medical examination at a health care facility - Plan: CBC with Differential/Platelet, Comprehensive metabolic panel, Lipid panel  Primary hypertension - Plan: CBC with Differential/Platelet, Comprehensive metabolic panel  Senile purpura (HCC)  Colon  adenomas  Benign prostatic hyperplasia with weak urinary stream - Plan: tadalafil (CIALIS) 5 MG tablet  Peyronie's disease  History of melanoma in situ  Cervical arthritis  S/P cataract extraction, right  Hyperlipidemia, unspecified hyperlipidemia type - Plan: Lipid panel  Hemorrhoids, unspecified hemorrhoid type  Diverticulosis  Family history of abdominal aortic aneurysm (AAA)  Screening for prostate cancer - Plan: PSA  Essential hypertension - Plan: lisinopril (ZESTRIL) 10 MG tablet  At risk for heart disease - Plan: atorvastatin (LIPITOR) 20 MG tablet His medications were renewed.  I also discussed his cardiovascular risk and the need for a statin drug.  He was comfortable with that.

## 2022-01-19 LAB — COMPREHENSIVE METABOLIC PANEL
ALT: 10 IU/L (ref 0–44)
AST: 17 IU/L (ref 0–40)
Albumin/Globulin Ratio: 2 (ref 1.2–2.2)
Albumin: 4.4 g/dL (ref 3.9–4.9)
Alkaline Phosphatase: 62 IU/L (ref 44–121)
BUN/Creatinine Ratio: 14 (ref 10–24)
BUN: 9 mg/dL (ref 8–27)
Bilirubin Total: 0.3 mg/dL (ref 0.0–1.2)
CO2: 23 mmol/L (ref 20–29)
Calcium: 9.5 mg/dL (ref 8.6–10.2)
Chloride: 95 mmol/L — ABNORMAL LOW (ref 96–106)
Creatinine, Ser: 0.65 mg/dL — ABNORMAL LOW (ref 0.76–1.27)
Globulin, Total: 2.2 g/dL (ref 1.5–4.5)
Glucose: 105 mg/dL — ABNORMAL HIGH (ref 70–99)
Potassium: 4.6 mmol/L (ref 3.5–5.2)
Sodium: 131 mmol/L — ABNORMAL LOW (ref 134–144)
Total Protein: 6.6 g/dL (ref 6.0–8.5)
eGFR: 103 mL/min/{1.73_m2} (ref >59)

## 2022-01-19 LAB — CBC WITH DIFFERENTIAL/PLATELET
Basophils Absolute: 0.1 10*3/uL (ref 0.0–0.2)
Basos: 1 %
EOS (ABSOLUTE): 0.2 10*3/uL (ref 0.0–0.4)
Eos: 4 %
Hematocrit: 41.2 % (ref 37.5–51.0)
Hemoglobin: 14.5 g/dL (ref 13.0–17.7)
Immature Grans (Abs): 0 10*3/uL (ref 0.0–0.1)
Immature Granulocytes: 0 %
Lymphocytes Absolute: 1.5 10*3/uL (ref 0.7–3.1)
Lymphs: 26 %
MCH: 33.6 pg — ABNORMAL HIGH (ref 26.6–33.0)
MCHC: 35.2 g/dL (ref 31.5–35.7)
MCV: 95 fL (ref 79–97)
Monocytes Absolute: 0.4 10*3/uL (ref 0.1–0.9)
Monocytes: 7 %
Neutrophils Absolute: 3.5 10*3/uL (ref 1.4–7.0)
Neutrophils: 62 %
Platelets: 307 10*3/uL (ref 150–450)
RBC: 4.32 x10E6/uL (ref 4.14–5.80)
RDW: 12.3 % (ref 11.6–15.4)
WBC: 5.7 10*3/uL (ref 3.4–10.8)

## 2022-01-19 LAB — LIPID PANEL
Chol/HDL Ratio: 2.8 ratio (ref 0.0–5.0)
Cholesterol, Total: 203 mg/dL — ABNORMAL HIGH (ref 100–199)
HDL: 73 mg/dL (ref >39)
LDL Chol Calc (NIH): 112 mg/dL — ABNORMAL HIGH (ref 0–99)
Triglycerides: 104 mg/dL (ref 0–149)
VLDL Cholesterol Cal: 18 mg/dL (ref 5–40)

## 2022-01-19 LAB — PSA: Prostate Specific Ag, Serum: 0.5 ng/mL (ref 0.0–4.0)

## 2022-02-05 ENCOUNTER — Encounter: Payer: Self-pay | Admitting: Family Medicine

## 2022-05-09 ENCOUNTER — Ambulatory Visit: Payer: Federal, State, Local not specified - PPO | Admitting: Family Medicine

## 2022-05-09 ENCOUNTER — Encounter: Payer: Self-pay | Admitting: Family Medicine

## 2022-05-09 VITALS — BP 138/78 | HR 73 | Temp 98.4°F | Resp 16 | Wt 106.8 lb

## 2022-05-09 DIAGNOSIS — M461 Sacroiliitis, not elsewhere classified: Secondary | ICD-10-CM

## 2022-05-09 NOTE — Patient Instructions (Signed)
You have sacroiliitis.  The therapy for this is heat for 20 minutes 3 times per day and gentle stretching afterwards and you can also take Tylenol and if that is not enough then go with either Advil or Aleve

## 2022-05-09 NOTE — Progress Notes (Signed)
   Subjective:    Patient ID: Jerry Douglas, male    DOB: 10-Jul-1955, 67 y.o.   MRN: [id number]  HPI Complains of a 4-week history of intermittent left-sided low back pain however on Friday the pain got more intense.  It does diminish when he gets up and moves around.  No numbness tingling or weakness down his leg.   Review of Systems     Objective:   Physical Exam  Alert and in no distress.  Tender to palpation over the left SI joint.  Full motion of the back.  FABER testing positive.      Assessment & Plan:  Sacroiliitis ([place]) The therapy for this is heat for 20 minutes 3 times per day and gentle stretching afterwards and you can also take Tylenol and if that is not enough then go with either Advil or Aleve

## 2022-12-06 ENCOUNTER — Other Ambulatory Visit (INDEPENDENT_AMBULATORY_CARE_PROVIDER_SITE_OTHER): Payer: Federal, State, Local not specified - PPO

## 2022-12-06 DIAGNOSIS — Z23 Encounter for immunization: Secondary | ICD-10-CM

## 2022-12-31 ENCOUNTER — Telehealth: Payer: Self-pay | Admitting: Family Medicine

## 2022-12-31 NOTE — Telephone Encounter (Signed)
P.A. TADALAFIL call FEP) at [phone].

## 2023-01-11 ENCOUNTER — Other Ambulatory Visit: Payer: Self-pay | Admitting: Family Medicine

## 2023-01-11 DIAGNOSIS — N401 Enlarged prostate with lower urinary tract symptoms: Secondary | ICD-10-CM

## 2023-01-11 DIAGNOSIS — Z9189 Other specified personal risk factors, not elsewhere classified: Secondary | ICD-10-CM

## 2023-01-29 ENCOUNTER — Ambulatory Visit (INDEPENDENT_AMBULATORY_CARE_PROVIDER_SITE_OTHER): Payer: Federal, State, Local not specified - PPO | Admitting: Family Medicine

## 2023-01-29 ENCOUNTER — Encounter: Payer: Self-pay | Admitting: Family Medicine

## 2023-01-29 ENCOUNTER — Other Ambulatory Visit (HOSPITAL_COMMUNITY): Payer: Self-pay

## 2023-01-29 ENCOUNTER — Telehealth: Payer: Self-pay

## 2023-01-29 VITALS — BP 138/78 | HR 78 | Ht 65.0 in | Wt 109.0 lb

## 2023-01-29 DIAGNOSIS — Z0000 Encounter for general adult medical examination without abnormal findings: Secondary | ICD-10-CM

## 2023-01-29 DIAGNOSIS — D126 Benign neoplasm of colon, unspecified: Secondary | ICD-10-CM

## 2023-01-29 DIAGNOSIS — N486 Induration penis plastica: Secondary | ICD-10-CM

## 2023-01-29 DIAGNOSIS — M47812 Spondylosis without myelopathy or radiculopathy, cervical region: Secondary | ICD-10-CM

## 2023-01-29 DIAGNOSIS — Z86006 Personal history of melanoma in-situ: Secondary | ICD-10-CM

## 2023-01-29 DIAGNOSIS — N400 Enlarged prostate without lower urinary tract symptoms: Secondary | ICD-10-CM

## 2023-01-29 DIAGNOSIS — D692 Other nonthrombocytopenic purpura: Secondary | ICD-10-CM

## 2023-01-29 DIAGNOSIS — N401 Enlarged prostate with lower urinary tract symptoms: Secondary | ICD-10-CM

## 2023-01-29 DIAGNOSIS — Z9189 Other specified personal risk factors, not elsewhere classified: Secondary | ICD-10-CM

## 2023-01-29 DIAGNOSIS — I10 Essential (primary) hypertension: Secondary | ICD-10-CM

## 2023-01-29 MED ORDER — ATORVASTATIN CALCIUM 20 MG PO TABS
20.0000 mg | ORAL_TABLET | Freq: Every day | ORAL | 0 refills | Status: DC
Start: 2023-01-29 — End: 2023-05-31

## 2023-01-29 MED ORDER — LISINOPRIL 10 MG PO TABS: 10.0000 mg | ORAL_TABLET | Freq: Every day | ORAL | 3 refills | Status: DC

## 2023-01-29 MED ORDER — TADALAFIL 5 MG PO TABS: ORAL_TABLET | ORAL | 3 refills | Status: DC

## 2023-01-29 NOTE — Telephone Encounter (Signed)
Pharmacy Patient Advocate Encounter  Received notification from [facility] that Prior Authorization for Tadalafil 5mg  has been APPROVED from 11.02.2024 to 12.02.2025. Ran test claim, Copay is $22.50. This test claim was processed through [facility]- copay amounts may vary at other pharmacies due to pharmacy/plan contracts, or as the patient moves through the different stages of their insurance plan.   PA #/Case ID/Reference #: [id number]

## 2023-01-29 NOTE — Telephone Encounter (Signed)
[id number],   Do we need to call the patient to let me know its been approved or do you do that too once approved or Denied?

## 2023-01-29 NOTE — Progress Notes (Signed)
Complete physical exam  Patient: Jerry Douglas   DOB: 01/23/1956   67 y.o. Male  MRN: [id number]  Subjective:      Jerry Douglas is a 67 y.o. male who presents today for a complete physical exam. He reports consuming a general diet.  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  He generally feels well. He reports sleeping fairly well. He does not have additional problems to discuss today.  He continues on Cialis to help with his BPH and is happy with this.  His arthritis causes very little difficulty.  He does have adenomatous colonic polyp and is scheduled for repeat colonoscopy in 2026.  He follows up with dermatology regularly for his melanoma.  Continues on Zestril for his blood pressure and is also taking Lipitor.  He has not had any difficulty with Peyronie's disease.  He has had a cataract removed.   Most recent fall risk assessment:    01/29/2023    9:05 AM  Fall Risk   Falls in the past year? 0  Number falls in past yr: 0  Injury with Fall? 0     Most recent depression screenings:    01/29/2023    9:05 AM 01/18/2022    8:18 AM  PHQ 2/9 Scores  PHQ - 2 Score 0 0    Vision:Within last year and Dental: No current dental problems and Last dental visit: October 2024    Patient Care Team: Ronnald Nian, MD as PCP - General (Family Medicine)   Outpatient Medications Prior to Visit  Medication Sig   Multiple Vitamins-Minerals (MULTIVITAMIN WITH MINERALS) tablet Take 1 tablet by mouth daily.     vitamin E 1000 UNIT capsule Take 1,000 Units by mouth daily.     polyvinyl alcohol (LIQUIFILM TEARS) 1.4 % ophthalmic solution Place 1 drop into both eyes 2 (two) times daily as needed for dry eyes. (Patient not taking: Reported on 01/29/2023)   [DISCONTINUED] atorvastatin (LIPITOR) 20 MG tablet TAKE 1 TABLET DAILY   [DISCONTINUED] lisinopril (ZESTRIL) 10 MG tablet Take 1 tablet (10 mg total) by mouth daily.   [DISCONTINUED] tadalafil (CIALIS) 5 MG tablet TAKE 1 TABLET DAILY    No facility-administered medications prior to visit.    Review of Systems  All other systems reviewed and are negative.         Objective:      The 10-year ASCVD risk score (Arnett DK, et al., 2019) is: 15.2%   Values used to calculate the score:     Age: 66 years     Sex: Male     Is Non-Hispanic African American: No     Diabetic: No     Tobacco smoker: No     Systolic Blood Pressure: 138 mmHg     Is BP treated: Yes     HDL Cholesterol: 77 mg/dL     Total Cholesterol: 168 mg/dL   Physical Exam  Alert and in no distress. Tympanic membranes and canals are normal. Pharyngeal area is normal. Neck is supple without adenopathy or thyromegaly. Cardiac exam shows a regular sinus rhythm without murmurs or gallops. Lungs are clear to auscultation.     Assessment & Plan:   Routine general medical examination at a health care facility - Plan: CBC with Differential/Platelet, Comprehensive metabolic panel, Lipid panel  Benign prostatic hyperplasia without lower urinary tract symptoms - Plan: tadalafil (CIALIS) 5 MG tablet  Colon adenomas  Cervical arthritis  History of melanoma in situ  Primary hypertension - Plan: CBC with Differential/Platelet, Comprehensive metabolic panel, lisinopril (ZESTRIL) 10 MG tablet  Peyronie's disease  Senile purpura (HCC)  At risk for heart disease - Plan: Lipid panel, atorvastatin (LIPITOR) 20 MG tablet   Immunization History  Administered Date(s) Administered   Fluad Quad(high Dose 65+) 12/04/2019, 01/04/2021, 11/15/2021   Fluad Trivalent(High Dose 65+) 12/06/2022   Influenza,inj,Quad PF,6+ Mos 12/27/2018   Influenza-Unspecified 11/27/2016   PFIZER Comirnaty(Gray Top)Covid-19 Tri-Sucrose Vaccine 07/15/2020   PFIZER(Purple Top)SARS-COV-2 Vaccination 05/14/2019, 06/04/2019, 12/04/2019   Pfizer Covid-19 Vaccine Bivalent Booster 10yrs & up 01/04/2021   Pfizer(Comirnaty)Fall Seasonal Vaccine 12 years and older 12/15/2021, 12/06/2022    Pneumococcal Conjugate-13 02/02/2021   Pneumococcal Polysaccharide-23 12/30/2019   Respiratory Syncytial Virus Vaccine,Recomb Aduvanted(Arexvy) 01/27/2022   Tdap 06/11/2006, 12/04/2016   Zoster Recombinant(Shingrix) 12/04/2016, 02/08/2017, 12/01/2020   Zoster, Live 11/19/2014    Health Maintenance  Topic Date Due   COVID-19 Vaccine (8 - 2023-24 season) 01/31/2023   DTaP/Tdap/Td (3 - Td or Tdap) 12/05/2026   Colonoscopy  03/18/2028   Pneumonia Vaccine 31+ Years old  Completed   INFLUENZA VACCINE  Completed   Hepatitis C Screening  Completed   Zoster Vaccines- Shingrix  Completed   HPV VACCINES  Aged Out    Continue on present medication regimen.  Follow-up here 1 year. Problem List Items Addressed This Visit     BPH (benign prostatic hyperplasia)   Relevant Medications   tadalafil (CIALIS) 5 MG tablet   Cervical arthritis   Colon adenomas   History of melanoma in situ   Hypertension   Relevant Medications   lisinopril (ZESTRIL) 10 MG tablet   atorvastatin (LIPITOR) 20 MG tablet   tadalafil (CIALIS) 5 MG tablet   Other Relevant Orders   CBC with Differential/Platelet   Comprehensive metabolic panel   Peyronie's disease   Senile purpura (HCC)   Relevant Medications   lisinopril (ZESTRIL) 10 MG tablet   atorvastatin (LIPITOR) 20 MG tablet   tadalafil (CIALIS) 5 MG tablet   Other Visit Diagnoses     Routine general medical examination at a health care facility    -  Primary   Relevant Orders   CBC with Differential/Platelet   Comprehensive metabolic panel   Lipid panel   At risk for heart disease       Relevant Medications   atorvastatin (LIPITOR) 20 MG tablet   Other Relevant Orders   Lipid panel      Follow-up 1 year   Sharlot Gowda, MD

## 2023-01-30 LAB — COMPREHENSIVE METABOLIC PANEL
ALT: 13 IU/L (ref 0–44)
AST: 20 IU/L (ref 0–40)
Albumin: 4.5 g/dL (ref 3.9–4.9)
Alkaline Phosphatase: 66 IU/L (ref 44–121)
BUN/Creatinine Ratio: 11 (ref 10–24)
BUN: 7 mg/dL — ABNORMAL LOW (ref 8–27)
Bilirubin Total: 0.4 mg/dL (ref 0.0–1.2)
CO2: 23 mmol/L (ref 20–29)
Calcium: 9.8 mg/dL (ref 8.6–10.2)
Chloride: 95 mmol/L — ABNORMAL LOW (ref 96–106)
Creatinine, Ser: 0.62 mg/dL — ABNORMAL LOW (ref 0.76–1.27)
Globulin, Total: 2 g/dL (ref 1.5–4.5)
Glucose: 93 mg/dL (ref 70–99)
Potassium: 5.1 mmol/L (ref 3.5–5.2)
Sodium: 133 mmol/L — ABNORMAL LOW (ref 134–144)
Total Protein: 6.5 g/dL (ref 6.0–8.5)
eGFR: 104 mL/min/{1.73_m2} (ref >59)

## 2023-01-30 LAB — CBC WITH DIFFERENTIAL/PLATELET
Basophils Absolute: 0.1 10*3/uL (ref 0.0–0.2)
Basos: 1 %
EOS (ABSOLUTE): 0.2 10*3/uL (ref 0.0–0.4)
Eos: 2 %
Hematocrit: 42 % (ref 37.5–51.0)
Hemoglobin: 14.2 g/dL (ref 13.0–17.7)
Immature Grans (Abs): 0 10*3/uL (ref 0.0–0.1)
Immature Granulocytes: 0 %
Lymphocytes Absolute: 1.3 10*3/uL (ref 0.7–3.1)
Lymphs: 16 %
MCH: 33.7 pg — ABNORMAL HIGH (ref 26.6–33.0)
MCHC: 33.8 g/dL (ref 31.5–35.7)
MCV: 100 fL — ABNORMAL HIGH (ref 79–97)
Monocytes Absolute: 0.6 10*3/uL (ref 0.1–0.9)
Monocytes: 7 %
Neutrophils Absolute: 6.1 10*3/uL (ref 1.4–7.0)
Neutrophils: 74 %
Platelets: 144 10*3/uL — ABNORMAL LOW (ref 150–450)
RBC: 4.21 x10E6/uL (ref 4.14–5.80)
RDW: 12.4 % (ref 11.6–15.4)
WBC: 8.3 10*3/uL (ref 3.4–10.8)

## 2023-01-30 LAB — LIPID PANEL
Cholesterol, Total: 168 mg/dL (ref 100–199)
HDL: 77 mg/dL (ref >39)
LDL CALC COMMENT:: 2.2 ratio (ref 0.0–5.0)
LDL Chol Calc (NIH): 80 mg/dL (ref 0–99)
Triglycerides: 56 mg/dL (ref 0–149)
VLDL Cholesterol Cal: 11 mg/dL (ref 5–40)

## 2023-02-01 ENCOUNTER — Encounter: Payer: Federal, State, Local not specified - PPO | Admitting: Family Medicine

## 2023-02-10 NOTE — Telephone Encounter (Signed)
Tadalafil 5mg  has been APPROVED from 11.02.2024 to 12.02.2025.

## 2023-04-24 ENCOUNTER — Encounter: Payer: Self-pay | Admitting: Internal Medicine

## 2023-05-30 ENCOUNTER — Other Ambulatory Visit: Payer: Self-pay | Admitting: Family Medicine

## 2023-05-30 DIAGNOSIS — Z9189 Other specified personal risk factors, not elsewhere classified: Secondary | ICD-10-CM

## 2023-10-17 ENCOUNTER — Telehealth: Payer: Self-pay

## 2023-10-17 DIAGNOSIS — I10 Essential (primary) hypertension: Secondary | ICD-10-CM

## 2023-10-17 DIAGNOSIS — E785 Hyperlipidemia, unspecified: Secondary | ICD-10-CM

## 2023-10-17 NOTE — Telephone Encounter (Signed)
Scheduled appt for labs

## 2023-10-17 NOTE — Telephone Encounter (Signed)
 Copied from CRM [id number]. Topic: Clinical - Request for Lab/Test Order >> Oct 17, 2023  8:08 AM Jerry Douglas wrote: Reason for CRM: Patient is wanting to have his labs done prior to his appointment on 03/04/24. Is requesting orders be placed so he can schedule a lab appointment a few days prior to that.  Patient can be reached at [phone]

## 2023-10-17 NOTE — Addendum Note (Signed)
 Addended by: JOYCE NORLEEN BROCKS on: 10/17/2023 01:31 PM   Modules accepted: Orders

## 2023-12-05 ENCOUNTER — Ambulatory Visit: Admitting: Family Medicine

## 2023-12-05 ENCOUNTER — Encounter: Payer: Self-pay | Admitting: Family Medicine

## 2023-12-05 ENCOUNTER — Ambulatory Visit

## 2023-12-05 VITALS — BP 130/82 | HR 67 | Wt 106.2 lb

## 2023-12-05 DIAGNOSIS — Z23 Encounter for immunization: Secondary | ICD-10-CM | POA: Diagnosis not present

## 2023-12-05 DIAGNOSIS — M25552 Pain in left hip: Secondary | ICD-10-CM | POA: Diagnosis not present

## 2023-12-05 MED ORDER — MELOXICAM 15 MG PO TABS: 15.0000 mg | ORAL_TABLET | Freq: Every day | ORAL | 0 refills | Status: DC

## 2023-12-05 NOTE — Addendum Note (Signed)
 Addended by: EFRAIN, Esiquio Boesen D on: 12/05/2023 11:45 AM   Modules accepted: Orders

## 2023-12-05 NOTE — Progress Notes (Signed)
   Name: Elliott Lasecki   Date of Visit: 12/05/23   Date of last visit with me: Visit date not found   CHIEF COMPLAINT:  Chief Complaint  Patient presents with   other    Lt. Hip and leg pain, pain going all the way down the lt. Leg and spasm in shin area,        HPI:  Discussed the use of AI scribe software for clinical note transcription with the patient, who gave verbal consent to proceed.  History of Present Illness   Taje Littler is a 68 year old male with a history of sacroiliitis who presents with progressive left hip and leg pain.  He began experiencing left hip pain last year, initially described as a stabbing sensation that would wake him up a couple of mornings a week. Over the last month, the pain has progressed and now radiates down his left leg into his shin, described as a spasm, particularly in the mornings. The pain subsides with walking, but on days he does not hike, it persists throughout the day. No pain radiating into his toes.  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 He is concerned about his ability to continue hiking daily as he ages. The pain is currently most intense in his shin and is only present on the left side.  In 2017, he sustained a triple Jefferson fracture after falling down wet steps and hitting his head on the sidewalk, which has led him to sleep on his back. 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  He primarily sleeps on his back due to a prior neck injury.         OBJECTIVE:       01/29/2023    9:05 AM  Depression screen PHQ 2/9  Decreased Interest 0  Down, Depressed, Hopeless 0  PHQ - 2 Score 0     BP Readings from Last 3 Encounters:  12/05/23 130/82  01/29/23 138/78  05/09/22 138/78    BP 130/82   Pulse 67   Wt 106 lb 3.2 oz (48.2 kg)   BMI 17.67 kg/m    Physical Exam    MUSCULOSKELETAL: Left hip pain on resisted abduction and left hip abduction weakness.      Physical Exam  There is tenderness to palpation over the greater trochanteric area on the left side.  There is noted weakness of the left hip with abduction against resistance, more pronounced on the left than the right.  Range of motion is full.  FADIR is negative but FABER is positive.  Straight leg test is negative. ASSESSMENT/PLAN:   Assessment & Plan Greater trochanteric pain syndrome of left lower extremity    Assessment and Plan    Left greater trochanteric bursitis Chronic bursitis with acute exacerbation. Pain radiates to shin, subsides with activity. Weakness in left gluteus medius and minimus. Symptoms consistent with trochanteric bursitis. - Prescribed meloxicam for two weeks. - Instructed on daily exercises to strengthen gluteus medius and minimus. - Advised to reduce walking for one week. - Reassess in six weeks; consider steroid injection if symptoms persist.         Lauren Aguayo A. Vita MD [facility]

## 2024-01-02 ENCOUNTER — Other Ambulatory Visit: Payer: Self-pay | Admitting: Family Medicine

## 2024-01-02 DIAGNOSIS — M25552 Pain in left hip: Secondary | ICD-10-CM

## 2024-01-02 NOTE — Telephone Encounter (Unsigned)
 Copied from CRM [id number]. Topic: Clinical - Medication Question >> Jan 02, 2024 10:59 AM Jerry Douglas wrote: Reason for CRM: Patients pharmacy requested a refill of meloxicam (MOBIC) 15 MG tablet. Patients states he has enough until Saturday. Would like to see if enough can be given until his next appointment with Dr Vita as it has been helping with his neck and sleeping. (See encounter from today)  Patient can be reached at [phone]

## 2024-01-02 NOTE — Telephone Encounter (Signed)
 Left voicemail for patient to call back.

## 2024-01-16 ENCOUNTER — Ambulatory Visit: Payer: Self-pay | Admitting: Family Medicine

## 2024-01-16 VITALS — BP 120/74 | HR 78 | Wt 107.2 lb

## 2024-01-16 DIAGNOSIS — M25552 Pain in left hip: Secondary | ICD-10-CM | POA: Diagnosis not present

## 2024-01-16 NOTE — Progress Notes (Signed)
   Name: Romani Wilbon   Date of Visit: 01/16/24   Date of last visit with me: 01/02/2024   CHIEF COMPLAINT:  Chief Complaint  Patient presents with   Follow-up    6 week follow up on hip and leg pain. Doing better, sometimes is stiff in am when wakes ups, but goes away after a few.        HPI:  Discussed the use of AI scribe software for clinical note transcription with the patient, who gave verbal consent to proceed.  History of Present Illness   Sayan Aldava is a 68 year old male who presents for follow-up of gluteal muscle pain.  He reports significant improvement in his gluteal muscle pain, feeling 'ninety-eight percent' better. The pain was previously exacerbated by walking. He has been performing exercises to strengthen his gluteus muscles, which has contributed to the improvement.  He has been using meloxicam for pain management but has been able to skip doses recently without significant discomfort. However, he notes that when he does not take the medication, he experiences some return of symptoms.  He has started using an Eclipse machine for exercise, achieving approximately three thousand steps while engaging in activities like doing a crossword. He is not keen on taking medications regularly and prefers to manage his condition through exercise and strengthening activities.         OBJECTIVE:       01/29/2023    9:05 AM  Depression screen PHQ 2/9  Decreased Interest 0  Down, Depressed, Hopeless 0  PHQ - 2 Score 0     BP Readings from Last 3 Encounters:  01/16/24 120/74  12/05/23 130/82  01/29/23 138/78    BP 120/74   Pulse 78   Wt 107 lb 3.2 oz (48.6 kg)   SpO2 98%   BMI 17.84 kg/m    Physical Exam          Physical Exam Neurological:     General: No focal deficit present.     Mental Status: He is alert and oriented to person, place, and time. Mental status is at baseline.     ASSESSMENT/PLAN:   Assessment & Plan Greater  trochanteric pain syndrome of left lower extremity    Assessment and Plan    Greater trochanteric pain syndrome Significant improvement, 98% better. Pain due to gluteus muscle weakness during abduction. Meloxicam effective but prefers minimal medication. - Continue strengthening exercises with increased weight. - Use gym equipment for abduction exercises. - Re-evaluate in January.         Keiley Levey A. Vita MD [facility]

## 2024-01-31 ENCOUNTER — Other Ambulatory Visit: Payer: Self-pay | Admitting: Family Medicine

## 2024-01-31 DIAGNOSIS — M25552 Pain in left hip: Secondary | ICD-10-CM

## 2024-02-07 ENCOUNTER — Encounter: Payer: Federal, State, Local not specified - PPO | Admitting: Family Medicine

## 2024-02-29 ENCOUNTER — Other Ambulatory Visit: Payer: Self-pay

## 2024-02-29 DIAGNOSIS — I10 Essential (primary) hypertension: Secondary | ICD-10-CM

## 2024-02-29 DIAGNOSIS — E785 Hyperlipidemia, unspecified: Secondary | ICD-10-CM

## 2024-02-29 LAB — LIPID PANEL

## 2024-03-01 LAB — CBC WITH DIFFERENTIAL/PLATELET
Basophils Absolute: 0.1 x10E3/uL (ref 0.0–0.2)
Basos: 1 %
EOS (ABSOLUTE): 0.3 x10E3/uL (ref 0.0–0.4)
Eos: 4 %
Hematocrit: 40.5 % (ref 37.5–51.0)
Hemoglobin: 13.8 g/dL (ref 13.0–17.7)
Immature Grans (Abs): 0 x10E3/uL (ref 0.0–0.1)
Immature Granulocytes: 0 %
Lymphocytes Absolute: 1.5 x10E3/uL (ref 0.7–3.1)
Lymphs: 22 %
MCH: 33.6 pg — ABNORMAL HIGH (ref 26.6–33.0)
MCHC: 34.1 g/dL (ref 31.5–35.7)
MCV: 99 fL — ABNORMAL HIGH (ref 79–97)
Monocytes Absolute: 0.5 x10E3/uL (ref 0.1–0.9)
Monocytes: 8 %
Neutrophils Absolute: 4.4 x10E3/uL (ref 1.4–7.0)
Neutrophils: 65 %
Platelets: 258 x10E3/uL (ref 150–450)
RBC: 4.11 x10E6/uL — ABNORMAL LOW (ref 4.14–5.80)
RDW: 12.4 % (ref 11.6–15.4)
WBC: 6.8 x10E3/uL (ref 3.4–10.8)

## 2024-03-01 LAB — COMPREHENSIVE METABOLIC PANEL WITH GFR
ALT: 14 IU/L (ref 0–44)
AST: 17 IU/L (ref 0–40)
Albumin: 4.2 g/dL (ref 3.9–4.9)
Alkaline Phosphatase: 66 IU/L (ref 47–123)
BUN/Creatinine Ratio: 18 (ref 10–24)
BUN: 10 mg/dL (ref 8–27)
Bilirubin Total: 0.5 mg/dL (ref 0.0–1.2)
CO2: 21 mmol/L (ref 20–29)
Calcium: 9.5 mg/dL (ref 8.6–10.2)
Chloride: 99 mmol/L (ref 96–106)
Creatinine, Ser: 0.55 mg/dL — AB (ref 0.76–1.27)
Globulin, Total: 2 g/dL (ref 1.5–4.5)
Glucose: 88 mg/dL (ref 70–99)
Potassium: 4.4 mmol/L (ref 3.5–5.2)
Sodium: 133 mmol/L — AB (ref 134–144)
Total Protein: 6.2 g/dL (ref 6.0–8.5)
eGFR: 107 mL/min/1.73

## 2024-03-01 LAB — LIPID PANEL
Cholesterol, Total: 158 mg/dL (ref 100–199)
HDL: 70 mg/dL
LDL CALC COMMENT:: 2.3 ratio (ref 0.0–5.0)
LDL Chol Calc (NIH): 74 mg/dL (ref 0–99)
Triglycerides: 74 mg/dL (ref 0–149)
VLDL Cholesterol Cal: 14 mg/dL (ref 5–40)

## 2024-03-04 ENCOUNTER — Ambulatory Visit (INDEPENDENT_AMBULATORY_CARE_PROVIDER_SITE_OTHER): Payer: Federal, State, Local not specified - PPO | Admitting: Family Medicine

## 2024-03-04 VITALS — BP 130/80 | HR 64 | Ht 64.25 in | Wt 108.2 lb

## 2024-03-04 DIAGNOSIS — M47812 Spondylosis without myelopathy or radiculopathy, cervical region: Secondary | ICD-10-CM | POA: Diagnosis not present

## 2024-03-04 DIAGNOSIS — E785 Hyperlipidemia, unspecified: Secondary | ICD-10-CM | POA: Diagnosis not present

## 2024-03-04 DIAGNOSIS — D692 Other nonthrombocytopenic purpura: Secondary | ICD-10-CM | POA: Diagnosis not present

## 2024-03-04 DIAGNOSIS — N486 Induration penis plastica: Secondary | ICD-10-CM | POA: Diagnosis not present

## 2024-03-04 DIAGNOSIS — D126 Benign neoplasm of colon, unspecified: Secondary | ICD-10-CM | POA: Diagnosis not present

## 2024-03-04 DIAGNOSIS — I10 Essential (primary) hypertension: Secondary | ICD-10-CM

## 2024-03-04 DIAGNOSIS — Z0000 Encounter for general adult medical examination without abnormal findings: Secondary | ICD-10-CM

## 2024-03-04 DIAGNOSIS — N400 Enlarged prostate without lower urinary tract symptoms: Secondary | ICD-10-CM | POA: Diagnosis not present

## 2024-03-04 DIAGNOSIS — Z8249 Family history of ischemic heart disease and other diseases of the circulatory system: Secondary | ICD-10-CM

## 2024-03-04 DIAGNOSIS — Z9841 Cataract extraction status, right eye: Secondary | ICD-10-CM | POA: Diagnosis not present

## 2024-03-04 DIAGNOSIS — K5790 Diverticulosis of intestine, part unspecified, without perforation or abscess without bleeding: Secondary | ICD-10-CM | POA: Diagnosis not present

## 2024-03-04 DIAGNOSIS — Z13220 Encounter for screening for lipoid disorders: Secondary | ICD-10-CM

## 2024-03-04 DIAGNOSIS — K649 Unspecified hemorrhoids: Secondary | ICD-10-CM

## 2024-03-04 DIAGNOSIS — M25552 Pain in left hip: Secondary | ICD-10-CM

## 2024-03-04 DIAGNOSIS — Z86006 Personal history of melanoma in-situ: Secondary | ICD-10-CM

## 2024-03-04 MED ORDER — TADALAFIL 5 MG PO TABS: ORAL_TABLET | ORAL | 3 refills | Status: AC

## 2024-03-04 MED ORDER — ATORVASTATIN CALCIUM 40 MG PO TABS: 40.0000 mg | ORAL_TABLET | Freq: Every day | ORAL | 3 refills | Status: AC

## 2024-03-04 MED ORDER — LISINOPRIL 10 MG PO TABS: 10.0000 mg | ORAL_TABLET | Freq: Every day | ORAL | 3 refills | Status: AC

## 2024-03-04 NOTE — Progress Notes (Signed)
" ° °  Subjective:    Patient ID: Jerry Douglas, male    DOB: 03-Jul-1955, 69 y.o.   MRN: [id number]  Discussed the use of AI scribe software for clinical note transcription with the patient, who gave verbal consent to proceed.  History of Present Illness   Jerry Douglas is a 69 year old male who presents for an annual physical exam.  He is retired and describes his life as stress-free. He is on Medicare with Part B and Part D coverage through [employer].  He takes lisinopril  for hypertension, having discontinued hydrochlorothiazide  a year and a half ago. He also takes atorvastatin  20 mg for hyperlipidemia, but recent lab results indicate that his LDL levels are not as low as expected.  He uses Cialis  at night to manage urinary symptoms, including hesitancy and decreased stream, which he attributes to prostate issues. Cialis  is effective in managing these symptoms.  He takes meloxicam  every other day for neck pain, which he finds more effective for his neck than for previous hip issues. He has a history of Jefferson fractures at C1 from 2017, which required hospitalization.  He has a history of Peyronie's disease, which he manages with vitamin E. The condition is 'pretty much' resolved.  He sees a dermatologist annually for melanoma surveillance, having previously been on a six-month schedule. He is due for a colonoscopy this fall, with his last one being six and a half years ago. He has a family history of aortic aneurysm and has had an aneurysm study done a couple of years ago, which was normal.  He has undergone cataract surgery in both eyes and has a history of hemorrhoids. No issues with smoking or alcohol use.           Review of Systems  All other systems reviewed and are negative.      Objective:    Physical Exam Physical Exam     Alert and in no distress. Tympanic membranes and canals are normal. Pharyngeal area is normal. Neck is supple without adenopathy or  thyromegaly. Cardiac exam shows a regular sinus rhythm without murmurs or gallops. Lungs are clear to auscultation. Purpuric Jama is noted on forearms           Assessment & Plan:  Assessment and Plan    Primary hypertension Hypertension managed with lisinopril . Blood pressure readings indicate good control. - Continue lisinopril .  Hyperlipidemia LDL cholesterol levels not optimal despite atorvastatin  therapy. - Increased atorvastatin  to 40 mg daily.  Benign prostatic hyperplasia Managed with tadalafil  for nocturia and urinary symptoms. - Continue tadalafil  (Cialis ) at night.  Peyronie's disease Well-controlled with vitamin E. - Continue vitamin E supplementation.  History of melanoma in situ Monitored with annual dermatology visits. No new lesions found. - Continue annual dermatology visits.  Cervical spondylosis Managed with meloxicam  as needed for neck pain. - Continue meloxicam  as needed for neck pain.  General Health Maintenance Immunizations up to date. Colonoscopy due for colon cancer screening. - Schedule colonoscopy before the end of the year.        "

## 2024-03-11 ENCOUNTER — Other Ambulatory Visit (HOSPITAL_COMMUNITY): Payer: Self-pay

## 2024-03-11 ENCOUNTER — Telehealth: Payer: Self-pay | Admitting: Pharmacy Technician

## 2024-03-11 NOTE — Telephone Encounter (Signed)
 Pharmacy Patient Advocate Encounter   Received notification from [facility] that prior authorization for Tadalafil  5MG  tablets is required/requested.   Insurance verification completed.   The patient is insured through [employer].   Per test claim: PA required; PA submitted to above mentioned insurance via Latent Key/confirmation #/EOC [id number] Status is pending

## 2024-03-11 NOTE — Telephone Encounter (Signed)
 [facility] mail order is requesting for patient to call to set up payment and to schedule the delivery.

## 2024-03-11 NOTE — Telephone Encounter (Signed)
 Pharmacy Patient Advocate Encounter  Received notification from [facility] that Prior Authorization for Tadalafil  5MG  tablets has been APPROVED from 03/11/2024 to 09/09/2024. Ran test claim, Copay is $15.00/90 day supply. This test claim was processed through [facility]- copay amounts may vary at other pharmacies due to pharmacy/plan contracts, or as the patient moves through the different stages of their insurance plan.   PA #/Case ID/Reference #: [id number]

## 2025-03-09 ENCOUNTER — Encounter: Admitting: Family Medicine
